# Patient Record
Sex: Female | Born: 1945 | Race: White | Hispanic: No | Marital: Married | Smoking: Never smoker
Health system: Southern US, Community
[De-identification: ages and names within clinical notes are randomized; demographics above are authoritative.]

## PROBLEM LIST (undated history)

## (undated) DIAGNOSIS — T753XXA Motion sickness, initial encounter: Secondary | ICD-10-CM

## (undated) DIAGNOSIS — M436 Torticollis: Secondary | ICD-10-CM

## (undated) DIAGNOSIS — K219 Gastro-esophageal reflux disease without esophagitis: Secondary | ICD-10-CM

## (undated) DIAGNOSIS — Z9221 Personal history of antineoplastic chemotherapy: Secondary | ICD-10-CM

## (undated) DIAGNOSIS — F32A Depression, unspecified: Secondary | ICD-10-CM

## (undated) DIAGNOSIS — Z923 Personal history of irradiation: Secondary | ICD-10-CM

## (undated) DIAGNOSIS — R011 Cardiac murmur, unspecified: Secondary | ICD-10-CM

## (undated) DIAGNOSIS — C50919 Malignant neoplasm of unspecified site of unspecified female breast: Secondary | ICD-10-CM

## (undated) DIAGNOSIS — F419 Anxiety disorder, unspecified: Secondary | ICD-10-CM

## (undated) DIAGNOSIS — C801 Malignant (primary) neoplasm, unspecified: Secondary | ICD-10-CM

## (undated) DIAGNOSIS — M1990 Unspecified osteoarthritis, unspecified site: Secondary | ICD-10-CM

## (undated) DIAGNOSIS — Z8719 Personal history of other diseases of the digestive system: Secondary | ICD-10-CM

## (undated) DIAGNOSIS — Z973 Presence of spectacles and contact lenses: Secondary | ICD-10-CM

## (undated) DIAGNOSIS — F329 Major depressive disorder, single episode, unspecified: Secondary | ICD-10-CM

## (undated) HISTORY — PX: BUNIONECTOMY: SHX129

## (undated) HISTORY — PX: JOINT REPLACEMENT: SHX530

## (undated) HISTORY — PX: COLONOSCOPY: SHX174

---
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
## 1978-07-02 HISTORY — PX: TUBAL LIGATION: SHX77

## 1980-08-05 HISTORY — PX: HEMORROIDECTOMY: SUR656

## 1989-08-05 HISTORY — PX: ABDOMINAL HYSTERECTOMY: SHX81

## 1998-07-31 ENCOUNTER — Encounter: Payer: Self-pay | Admitting: Orthopedic Surgery

## 1998-07-31 ENCOUNTER — Ambulatory Visit (HOSPITAL_COMMUNITY): Admission: RE | Admit: 1998-07-31 | Discharge: 1998-07-31 | Payer: Self-pay | Admitting: Orthopedic Surgery

## 1998-08-03 ENCOUNTER — Encounter: Admission: RE | Admit: 1998-08-03 | Discharge: 1998-11-01 | Payer: Self-pay | Admitting: Anesthesiology

## 1999-08-01 HISTORY — PX: KNEE ARTHROSCOPY: SHX127

## 2000-02-11 ENCOUNTER — Ambulatory Visit (HOSPITAL_COMMUNITY): Admission: RE | Admit: 2000-02-11 | Discharge: 2000-02-11 | Payer: Self-pay | Admitting: Orthopedic Surgery

## 2000-06-13 ENCOUNTER — Encounter: Payer: Self-pay | Admitting: Family Medicine

## 2000-06-13 ENCOUNTER — Encounter: Admission: RE | Admit: 2000-06-13 | Discharge: 2000-06-13 | Payer: Self-pay | Admitting: Family Medicine

## 2002-05-13 ENCOUNTER — Ambulatory Visit (HOSPITAL_COMMUNITY): Admission: RE | Admit: 2002-05-13 | Discharge: 2002-05-13 | Payer: Self-pay | Admitting: Urology

## 2002-05-13 ENCOUNTER — Encounter: Payer: Self-pay | Admitting: Urology

## 2003-08-06 DIAGNOSIS — C801 Malignant (primary) neoplasm, unspecified: Secondary | ICD-10-CM

## 2003-08-06 DIAGNOSIS — C50919 Malignant neoplasm of unspecified site of unspecified female breast: Secondary | ICD-10-CM

## 2003-08-06 HISTORY — DX: Malignant neoplasm of unspecified site of unspecified female breast: C50.919

## 2003-08-06 HISTORY — PX: BREAST EXCISIONAL BIOPSY: SUR124

## 2003-08-06 HISTORY — PX: BREAST BIOPSY: SHX20

## 2003-08-06 HISTORY — DX: Malignant (primary) neoplasm, unspecified: C80.1

## 2004-01-24 ENCOUNTER — Other Ambulatory Visit: Payer: Self-pay

## 2004-05-05 ENCOUNTER — Ambulatory Visit: Payer: Self-pay | Admitting: Oncology

## 2004-06-05 ENCOUNTER — Ambulatory Visit: Payer: Self-pay | Admitting: Oncology

## 2004-07-05 ENCOUNTER — Ambulatory Visit: Payer: Self-pay | Admitting: Oncology

## 2004-08-05 ENCOUNTER — Ambulatory Visit: Payer: Self-pay | Admitting: Oncology

## 2004-08-17 ENCOUNTER — Ambulatory Visit: Payer: Self-pay | Admitting: General Surgery

## 2004-09-05 ENCOUNTER — Ambulatory Visit: Payer: Self-pay | Admitting: Oncology

## 2005-01-29 ENCOUNTER — Ambulatory Visit: Payer: Self-pay | Admitting: Oncology

## 2005-02-13 ENCOUNTER — Ambulatory Visit: Payer: Self-pay | Admitting: General Surgery

## 2005-02-20 ENCOUNTER — Ambulatory Visit: Payer: Self-pay | Admitting: Oncology

## 2005-03-05 ENCOUNTER — Ambulatory Visit: Payer: Self-pay | Admitting: Oncology

## 2005-05-23 ENCOUNTER — Ambulatory Visit: Payer: Self-pay | Admitting: Oncology

## 2005-06-05 ENCOUNTER — Ambulatory Visit: Payer: Self-pay | Admitting: Oncology

## 2005-08-22 ENCOUNTER — Ambulatory Visit: Payer: Self-pay | Admitting: Oncology

## 2005-09-05 ENCOUNTER — Ambulatory Visit: Payer: Self-pay | Admitting: Oncology

## 2005-09-27 ENCOUNTER — Ambulatory Visit: Payer: Self-pay | Admitting: General Surgery

## 2005-12-19 ENCOUNTER — Ambulatory Visit: Payer: Self-pay | Admitting: Oncology

## 2006-01-03 ENCOUNTER — Ambulatory Visit: Payer: Self-pay | Admitting: Oncology

## 2006-03-27 ENCOUNTER — Ambulatory Visit: Payer: Self-pay | Admitting: General Surgery

## 2006-04-04 ENCOUNTER — Ambulatory Visit: Payer: Self-pay | Admitting: Gastroenterology

## 2006-04-18 ENCOUNTER — Ambulatory Visit: Payer: Self-pay | Admitting: Oncology

## 2006-05-05 ENCOUNTER — Ambulatory Visit: Payer: Self-pay | Admitting: Oncology

## 2006-05-08 ENCOUNTER — Ambulatory Visit: Payer: Self-pay | Admitting: Gastroenterology

## 2006-05-12 ENCOUNTER — Other Ambulatory Visit: Payer: Self-pay

## 2006-05-12 ENCOUNTER — Emergency Department: Payer: Self-pay | Admitting: Emergency Medicine

## 2006-08-27 ENCOUNTER — Ambulatory Visit: Payer: Self-pay | Admitting: Oncology

## 2006-09-05 ENCOUNTER — Ambulatory Visit: Payer: Self-pay | Admitting: Oncology

## 2006-09-25 ENCOUNTER — Ambulatory Visit: Payer: Self-pay | Admitting: General Surgery

## 2006-10-04 ENCOUNTER — Ambulatory Visit: Payer: Self-pay | Admitting: Oncology

## 2007-01-04 ENCOUNTER — Ambulatory Visit: Payer: Self-pay | Admitting: Internal Medicine

## 2007-01-22 ENCOUNTER — Ambulatory Visit: Payer: Self-pay | Admitting: Internal Medicine

## 2007-02-03 ENCOUNTER — Ambulatory Visit: Payer: Self-pay | Admitting: Internal Medicine

## 2007-08-06 ENCOUNTER — Ambulatory Visit: Payer: Self-pay | Admitting: Internal Medicine

## 2007-08-13 ENCOUNTER — Ambulatory Visit: Payer: Self-pay | Admitting: Internal Medicine

## 2007-09-06 ENCOUNTER — Ambulatory Visit: Payer: Self-pay | Admitting: Internal Medicine

## 2007-11-04 ENCOUNTER — Ambulatory Visit: Payer: Self-pay | Admitting: Oncology

## 2007-11-10 ENCOUNTER — Ambulatory Visit: Payer: Self-pay | Admitting: General Surgery

## 2008-02-03 ENCOUNTER — Ambulatory Visit: Payer: Self-pay | Admitting: Internal Medicine

## 2008-02-11 ENCOUNTER — Ambulatory Visit: Payer: Self-pay | Admitting: Internal Medicine

## 2008-03-05 ENCOUNTER — Ambulatory Visit: Payer: Self-pay | Admitting: Internal Medicine

## 2008-08-05 ENCOUNTER — Ambulatory Visit: Payer: Self-pay | Admitting: Internal Medicine

## 2008-08-25 ENCOUNTER — Ambulatory Visit: Payer: Self-pay | Admitting: Internal Medicine

## 2008-09-05 ENCOUNTER — Ambulatory Visit: Payer: Self-pay | Admitting: Internal Medicine

## 2008-09-06 HISTORY — PX: CARPAL TUNNEL RELEASE: SHX101

## 2008-10-07 HISTORY — PX: FOOT SURGERY: SHX648

## 2008-11-11 ENCOUNTER — Ambulatory Visit: Payer: Self-pay | Admitting: Internal Medicine

## 2009-01-03 ENCOUNTER — Ambulatory Visit: Payer: Self-pay | Admitting: Internal Medicine

## 2009-01-13 ENCOUNTER — Ambulatory Visit: Payer: Self-pay | Admitting: Internal Medicine

## 2009-02-02 ENCOUNTER — Ambulatory Visit: Payer: Self-pay | Admitting: Internal Medicine

## 2009-04-11 ENCOUNTER — Ambulatory Visit: Payer: Self-pay | Admitting: Family Medicine

## 2009-04-13 HISTORY — PX: THUMB ARTHROSCOPY: SHX2509

## 2009-05-15 ENCOUNTER — Ambulatory Visit: Payer: Self-pay | Admitting: Gastroenterology

## 2009-11-29 ENCOUNTER — Ambulatory Visit: Payer: Self-pay | Admitting: Internal Medicine

## 2010-01-03 ENCOUNTER — Ambulatory Visit: Payer: Self-pay | Admitting: Internal Medicine

## 2010-01-11 ENCOUNTER — Ambulatory Visit: Payer: Self-pay | Admitting: Internal Medicine

## 2010-02-02 ENCOUNTER — Ambulatory Visit: Payer: Self-pay | Admitting: Internal Medicine

## 2010-12-05 ENCOUNTER — Ambulatory Visit: Payer: Self-pay | Admitting: Internal Medicine

## 2011-01-04 HISTORY — PX: HAMMER TOE SURGERY: SHX385

## 2011-02-01 ENCOUNTER — Ambulatory Visit: Payer: Self-pay | Admitting: Internal Medicine

## 2011-05-16 ENCOUNTER — Ambulatory Visit: Payer: Self-pay | Admitting: Internal Medicine

## 2011-05-17 LAB — CANCER ANTIGEN 27.29: CA 27.29: 29.4 U/mL (ref 0.0–38.6)

## 2011-06-06 ENCOUNTER — Ambulatory Visit: Payer: Self-pay | Admitting: Internal Medicine

## 2011-08-08 ENCOUNTER — Ambulatory Visit: Payer: Self-pay | Admitting: Family Medicine

## 2011-12-10 ENCOUNTER — Ambulatory Visit: Payer: Self-pay | Admitting: Internal Medicine

## 2011-12-12 ENCOUNTER — Ambulatory Visit: Payer: Self-pay | Admitting: Internal Medicine

## 2011-12-13 ENCOUNTER — Ambulatory Visit: Payer: Self-pay | Admitting: Internal Medicine

## 2012-04-28 ENCOUNTER — Ambulatory Visit: Payer: Self-pay | Admitting: Oncology

## 2012-05-08 ENCOUNTER — Ambulatory Visit: Payer: Self-pay | Admitting: Oncology

## 2012-05-08 LAB — CBC CANCER CENTER
Basophil #: 0.1 x10 3/mm (ref 0.0–0.1)
Basophil %: 1 %
Eosinophil #: 0.3 x10 3/mm (ref 0.0–0.7)
Eosinophil %: 4.8 %
HCT: 42.5 % (ref 35.0–47.0)
HGB: 13.8 g/dL (ref 12.0–16.0)
Lymphocyte #: 1.7 x10 3/mm (ref 1.0–3.6)
Lymphocyte %: 29.2 %
MCH: 31.3 pg (ref 26.0–34.0)
MCHC: 32.5 g/dL (ref 32.0–36.0)
MCV: 96 fL (ref 80–100)
Monocyte #: 0.4 x10 3/mm (ref 0.2–0.9)
Monocyte %: 6.4 %
Neutrophil #: 3.4 x10 3/mm (ref 1.4–6.5)
Neutrophil %: 58.6 %
Platelet: 201 x10 3/mm (ref 150–440)
RBC: 4.42 10*6/uL (ref 3.80–5.20)
RDW: 12.3 % (ref 11.5–14.5)
WBC: 5.8 x10 3/mm (ref 3.6–11.0)

## 2012-05-08 LAB — COMPREHENSIVE METABOLIC PANEL
Albumin: 3.5 g/dL (ref 3.4–5.0)
Alkaline Phosphatase: 73 U/L (ref 50–136)
Anion Gap: 11 (ref 7–16)
BUN: 19 mg/dL — ABNORMAL HIGH (ref 7–18)
Bilirubin,Total: 0.3 mg/dL (ref 0.2–1.0)
Calcium, Total: 8.9 mg/dL (ref 8.5–10.1)
Chloride: 104 mmol/L (ref 98–107)
Co2: 26 mmol/L (ref 21–32)
Creatinine: 0.92 mg/dL (ref 0.60–1.30)
EGFR (African American): >60
EGFR (Non-African Amer.): >60
Glucose: 95 mg/dL (ref 65–99)
Osmolality: 283 (ref 275–301)
Potassium: 4 mmol/L (ref 3.5–5.1)
SGOT(AST): 22 U/L (ref 15–37)
SGPT (ALT): 30 U/L (ref 12–78)
Sodium: 141 mmol/L (ref 136–145)
Total Protein: 7 g/dL (ref 6.4–8.2)

## 2012-05-12 LAB — CANCER ANTIGEN 27.29: CA 27.29: 25.9 U/mL (ref 0.0–38.6)

## 2012-06-05 ENCOUNTER — Ambulatory Visit: Payer: Self-pay | Admitting: Oncology

## 2012-09-25 ENCOUNTER — Ambulatory Visit: Payer: Self-pay | Admitting: Unknown Physician Specialty

## 2012-09-28 LAB — PATHOLOGY REPORT

## 2012-12-14 ENCOUNTER — Ambulatory Visit: Payer: Self-pay | Admitting: Oncology

## 2013-05-17 ENCOUNTER — Ambulatory Visit: Payer: Self-pay | Admitting: Oncology

## 2013-05-18 LAB — CANCER ANTIGEN 27.29: CA 27.29: 29.3 U/mL (ref 0.0–38.6)

## 2013-06-05 ENCOUNTER — Ambulatory Visit: Payer: Self-pay | Admitting: Oncology

## 2013-08-05 HISTORY — PX: BREAST BIOPSY: SHX20

## 2013-12-16 ENCOUNTER — Ambulatory Visit: Payer: Self-pay | Admitting: Oncology

## 2013-12-23 ENCOUNTER — Ambulatory Visit: Payer: Self-pay | Admitting: Oncology

## 2013-12-28 ENCOUNTER — Ambulatory Visit: Payer: Self-pay | Admitting: Oncology

## 2013-12-29 LAB — PATHOLOGY REPORT

## 2014-02-28 ENCOUNTER — Other Ambulatory Visit: Payer: Self-pay | Admitting: Podiatry

## 2014-05-23 ENCOUNTER — Ambulatory Visit: Payer: Self-pay | Admitting: Oncology

## 2014-05-24 LAB — CANCER ANTIGEN 27.29: CA 27.29: 20.9 U/mL (ref 0.0–38.6)

## 2014-06-05 ENCOUNTER — Ambulatory Visit: Payer: Self-pay | Admitting: Oncology

## 2015-02-06 ENCOUNTER — Other Ambulatory Visit: Payer: Self-pay | Admitting: Podiatry

## 2015-04-19 ENCOUNTER — Other Ambulatory Visit: Payer: Self-pay | Admitting: Urology

## 2015-04-19 DIAGNOSIS — Q6211 Congenital occlusion of ureteropelvic junction: Principal | ICD-10-CM

## 2015-04-19 DIAGNOSIS — Q6239 Other obstructive defects of renal pelvis and ureter: Secondary | ICD-10-CM

## 2015-04-24 ENCOUNTER — Ambulatory Visit (HOSPITAL_COMMUNITY): Payer: Medicare HMO

## 2015-05-02 ENCOUNTER — Ambulatory Visit (HOSPITAL_COMMUNITY)
Admission: RE | Admit: 2015-05-02 | Discharge: 2015-05-02 | Disposition: A | Discharge status: Home / Self Care | Payer: Medicare HMO | Source: Ambulatory Visit | Attending: Urology | Admitting: Urology

## 2015-05-02 DIAGNOSIS — Q6239 Other obstructive defects of renal pelvis and ureter: Secondary | ICD-10-CM | POA: Insufficient documentation

## 2015-05-02 DIAGNOSIS — Q6211 Congenital occlusion of ureteropelvic junction: Secondary | ICD-10-CM

## 2015-05-02 MED ORDER — FUROSEMIDE 10 MG/ML IJ SOLN: 37.0000 mg | Freq: Once | INTRAMUSCULAR | Status: DC

## 2015-05-02 MED ORDER — TECHNETIUM TC 99M MERTIATIDE
15.0000 | Freq: Once | INTRAVENOUS | Status: AC | PRN
  Administered 2015-05-02: 15.7 via INTRAVENOUS

## 2015-05-02 MED ORDER — FUROSEMIDE 10 MG/ML IJ SOLN
INTRAMUSCULAR | Status: AC
  Filled 2015-05-02: qty 4

## 2015-08-09 ENCOUNTER — Other Ambulatory Visit: Payer: Self-pay | Admitting: Family Medicine

## 2015-08-09 DIAGNOSIS — Z1231 Encounter for screening mammogram for malignant neoplasm of breast: Secondary | ICD-10-CM

## 2015-08-30 ENCOUNTER — Ambulatory Visit
Admission: RE | Admit: 2015-08-30 | Discharge: 2015-08-30 | Disposition: A | Discharge status: Home / Self Care | Payer: Medicare HMO | Source: Ambulatory Visit | Attending: Family Medicine | Admitting: Family Medicine

## 2015-08-30 ENCOUNTER — Other Ambulatory Visit: Payer: Self-pay | Admitting: Family Medicine

## 2015-08-30 DIAGNOSIS — Z1231 Encounter for screening mammogram for malignant neoplasm of breast: Secondary | ICD-10-CM | POA: Insufficient documentation

## 2015-08-30 HISTORY — DX: Malignant neoplasm of unspecified site of unspecified female breast: C50.919

## 2015-08-30 HISTORY — DX: Malignant (primary) neoplasm, unspecified: C80.1

## 2016-08-28 ENCOUNTER — Other Ambulatory Visit: Payer: Self-pay | Admitting: Student

## 2016-08-28 DIAGNOSIS — R599 Enlarged lymph nodes, unspecified: Secondary | ICD-10-CM

## 2016-08-28 DIAGNOSIS — N888 Other specified noninflammatory disorders of cervix uteri: Secondary | ICD-10-CM

## 2016-09-05 ENCOUNTER — Telehealth: Payer: Self-pay | Admitting: Student

## 2016-09-06 ENCOUNTER — Ambulatory Visit
Admission: RE | Admit: 2016-09-06 | Discharge: 2016-09-06 | Disposition: A | Discharge status: Home / Self Care | Payer: Medicare HMO | Source: Ambulatory Visit | Attending: Student | Admitting: Student

## 2016-09-06 ENCOUNTER — Ambulatory Visit: Payer: Medicare HMO

## 2016-09-06 DIAGNOSIS — N888 Other specified noninflammatory disorders of cervix uteri: Secondary | ICD-10-CM

## 2016-09-06 DIAGNOSIS — R599 Enlarged lymph nodes, unspecified: Secondary | ICD-10-CM

## 2016-09-12 ENCOUNTER — Other Ambulatory Visit: Payer: Self-pay | Admitting: Family Medicine

## 2016-09-12 DIAGNOSIS — Z1231 Encounter for screening mammogram for malignant neoplasm of breast: Secondary | ICD-10-CM

## 2016-09-13 ENCOUNTER — Ambulatory Visit
Admission: RE | Admit: 2016-09-13 | Discharge: 2016-09-13 | Disposition: A | Discharge status: Home / Self Care | Payer: Medicare HMO | Source: Ambulatory Visit | Attending: Student | Admitting: Student

## 2016-09-13 DIAGNOSIS — R599 Enlarged lymph nodes, unspecified: Secondary | ICD-10-CM | POA: Diagnosis present

## 2016-09-13 DIAGNOSIS — M4692 Unspecified inflammatory spondylopathy, cervical region: Secondary | ICD-10-CM | POA: Insufficient documentation

## 2016-09-13 MED ORDER — GADOBENATE DIMEGLUMINE 529 MG/ML IV SOLN
14.0000 mL | Freq: Once | INTRAVENOUS | Status: AC | PRN
  Administered 2016-09-13: 14 mL via INTRAVENOUS

## 2016-09-19 ENCOUNTER — Ambulatory Visit: Payer: Medicare HMO

## 2016-10-11 ENCOUNTER — Ambulatory Visit
Admission: RE | Admit: 2016-10-11 | Discharge: 2016-10-11 | Disposition: A | Discharge status: Home / Self Care | Payer: Medicare HMO | Source: Ambulatory Visit | Attending: Family Medicine | Admitting: Family Medicine

## 2016-10-11 DIAGNOSIS — Z1231 Encounter for screening mammogram for malignant neoplasm of breast: Secondary | ICD-10-CM | POA: Diagnosis present

## 2017-09-09 ENCOUNTER — Other Ambulatory Visit: Payer: Self-pay | Admitting: Family Medicine

## 2017-09-09 DIAGNOSIS — Z1231 Encounter for screening mammogram for malignant neoplasm of breast: Secondary | ICD-10-CM

## 2017-10-14 ENCOUNTER — Ambulatory Visit
Admission: RE | Admit: 2017-10-14 | Discharge: 2017-10-14 | Disposition: A | Discharge status: Home / Self Care | Payer: Medicare HMO | Source: Ambulatory Visit | Attending: Family Medicine | Admitting: Family Medicine

## 2017-10-14 DIAGNOSIS — Z1231 Encounter for screening mammogram for malignant neoplasm of breast: Secondary | ICD-10-CM | POA: Diagnosis not present

## 2017-10-14 HISTORY — DX: Personal history of antineoplastic chemotherapy: Z92.21

## 2017-10-14 HISTORY — DX: Personal history of irradiation: Z92.3

## 2018-01-27 ENCOUNTER — Encounter: Payer: Self-pay | Admitting: *Deleted

## 2018-01-27 ENCOUNTER — Other Ambulatory Visit: Payer: Self-pay

## 2018-01-30 NOTE — Discharge Instructions (Signed)

## 2018-02-03 ENCOUNTER — Encounter
Admission: RE | Disposition: A | Discharge status: Home / Self Care | Payer: Self-pay | Source: Ambulatory Visit | Attending: Ophthalmology

## 2018-02-03 ENCOUNTER — Ambulatory Visit: Payer: Medicare HMO | Admitting: Anesthesiology

## 2018-02-03 ENCOUNTER — Ambulatory Visit
Admission: RE | Admit: 2018-02-03 | Discharge: 2018-02-03 | Disposition: A | Discharge status: Home / Self Care | Payer: Medicare HMO | Source: Ambulatory Visit | Attending: Ophthalmology | Admitting: Ophthalmology

## 2018-02-03 DIAGNOSIS — M810 Age-related osteoporosis without current pathological fracture: Secondary | ICD-10-CM | POA: Diagnosis not present

## 2018-02-03 DIAGNOSIS — Z885 Allergy status to narcotic agent status: Secondary | ICD-10-CM | POA: Diagnosis not present

## 2018-02-03 DIAGNOSIS — K219 Gastro-esophageal reflux disease without esophagitis: Secondary | ICD-10-CM | POA: Insufficient documentation

## 2018-02-03 DIAGNOSIS — Z8619 Personal history of other infectious and parasitic diseases: Secondary | ICD-10-CM | POA: Insufficient documentation

## 2018-02-03 DIAGNOSIS — Z79899 Other long term (current) drug therapy: Secondary | ICD-10-CM | POA: Insufficient documentation

## 2018-02-03 DIAGNOSIS — Z853 Personal history of malignant neoplasm of breast: Secondary | ICD-10-CM | POA: Diagnosis not present

## 2018-02-03 DIAGNOSIS — H2511 Age-related nuclear cataract, right eye: Secondary | ICD-10-CM | POA: Insufficient documentation

## 2018-02-03 DIAGNOSIS — Z8719 Personal history of other diseases of the digestive system: Secondary | ICD-10-CM | POA: Insufficient documentation

## 2018-02-03 DIAGNOSIS — F329 Major depressive disorder, single episode, unspecified: Secondary | ICD-10-CM | POA: Diagnosis not present

## 2018-02-03 HISTORY — PX: CATARACT EXTRACTION W/PHACO: SHX586

## 2018-02-03 HISTORY — DX: Motion sickness, initial encounter: T75.3XXA

## 2018-02-03 HISTORY — DX: Gastro-esophageal reflux disease without esophagitis: K21.9

## 2018-02-03 HISTORY — DX: Unspecified osteoarthritis, unspecified site: M19.90

## 2018-02-03 HISTORY — DX: Torticollis: M43.6

## 2018-02-03 HISTORY — DX: Presence of spectacles and contact lenses: Z97.3

## 2018-02-03 SURGERY — PHACOEMULSIFICATION, CATARACT, WITH IOL INSERTION
Anesthesia: Monitor Anesthesia Care | Site: Eye | Laterality: Right | Wound class: Clean

## 2018-02-03 MED ORDER — CEFUROXIME OPHTHALMIC INJECTION 1 MG/0.1 ML
INJECTION | OPHTHALMIC | Status: DC | PRN
  Administered 2018-02-03: 0.1 mL via INTRACAMERAL

## 2018-02-03 MED ORDER — LACTATED RINGERS IV SOLN: INTRAVENOUS | Status: DC

## 2018-02-03 MED ORDER — LIDOCAINE HCL (PF) 2 % IJ SOLN
INTRAOCULAR | Status: DC | PRN
  Administered 2018-02-03: 2 mL

## 2018-02-03 MED ORDER — MIDAZOLAM HCL 2 MG/2ML IJ SOLN
INTRAMUSCULAR | Status: DC | PRN
  Administered 2018-02-03 (×2): 1 mg via INTRAVENOUS

## 2018-02-03 MED ORDER — ARMC OPHTHALMIC DILATING DROPS
1.0000 "application " | OPHTHALMIC | Status: DC | PRN
  Administered 2018-02-03 (×3): 1 via OPHTHALMIC

## 2018-02-03 MED ORDER — EPINEPHRINE PF 1 MG/ML IJ SOLN
INTRAOCULAR | Status: DC | PRN
  Administered 2018-02-03: 46 mL via OPHTHALMIC

## 2018-02-03 MED ORDER — BRIMONIDINE TARTRATE-TIMOLOL 0.2-0.5 % OP SOLN
OPHTHALMIC | Status: DC | PRN
  Administered 2018-02-03: 1 [drp] via OPHTHALMIC

## 2018-02-03 MED ORDER — NA HYALUR & NA CHOND-NA HYALUR 0.4-0.35 ML IO KIT
PACK | INTRAOCULAR | Status: DC | PRN
  Administered 2018-02-03: 1 mL via INTRAOCULAR

## 2018-02-03 MED ORDER — FENTANYL CITRATE (PF) 100 MCG/2ML IJ SOLN
INTRAMUSCULAR | Status: DC | PRN
  Administered 2018-02-03 (×2): 50 ug via INTRAVENOUS

## 2018-02-03 MED ORDER — MOXIFLOXACIN HCL 0.5 % OP SOLN
1.0000 [drp] | OPHTHALMIC | Status: DC | PRN
  Administered 2018-02-03 (×3): 1 [drp] via OPHTHALMIC

## 2018-02-03 SURGICAL SUPPLY — 20 items
CANNULA ANT/CHMB 27G (MISCELLANEOUS) ×1 IMPLANT
CANNULA ANT/CHMB 27GA (MISCELLANEOUS) ×2 IMPLANT
GLOVE SURG LX 7.5 STRW (GLOVE) ×1
GLOVE SURG LX STRL 7.5 STRW (GLOVE) ×1 IMPLANT
GLOVE SURG TRIUMPH 8.0 PF LTX (GLOVE) ×2 IMPLANT
GOWN STRL REUS W/ TWL LRG LVL3 (GOWN DISPOSABLE) ×2 IMPLANT
GOWN STRL REUS W/TWL LRG LVL3 (GOWN DISPOSABLE) ×4
LENS IOL TECNIS ITEC 23.5 (Intraocular Lens) ×1 IMPLANT
MARKER SKIN DUAL TIP RULER LAB (MISCELLANEOUS) ×2 IMPLANT
NDL FILTER BLUNT 18X1 1/2 (NEEDLE) ×1 IMPLANT
NEEDLE FILTER BLUNT 18X 1/2SAF (NEEDLE) ×1
NEEDLE FILTER BLUNT 18X1 1/2 (NEEDLE) ×1 IMPLANT
PACK CATARACT BRASINGTON (MISCELLANEOUS) ×2 IMPLANT
PACK EYE AFTER SURG (MISCELLANEOUS) ×2 IMPLANT
PACK OPTHALMIC (MISCELLANEOUS) ×2 IMPLANT
SYR 3ML LL SCALE MARK (SYRINGE) ×2 IMPLANT
SYR 5ML LL (SYRINGE) ×2 IMPLANT
SYR TB 1ML LUER SLIP (SYRINGE) ×2 IMPLANT
WATER STERILE IRR 500ML POUR (IV SOLUTION) ×2 IMPLANT
WIPE NON LINTING 3.25X3.25 (MISCELLANEOUS) ×2 IMPLANT

## 2018-02-03 NOTE — Anesthesia Postprocedure Evaluation (Signed)
Anesthesia Post Note  Patient: Miranda Hampton  Procedure(s) Performed: CATARACT EXTRACTION PHACO AND INTRAOCULAR LENS PLACEMENT (IOC) RIGHT (Right Eye)  Patient location during evaluation: PACU Anesthesia Type: MAC Level of consciousness: awake and alert Pain management: pain level controlled Vital Signs Assessment: post-procedure vital signs reviewed and stable Respiratory status: spontaneous breathing Cardiovascular status: blood pressure returned to baseline Postop Assessment: no headache Anesthetic complications: no    Jaci Standard, III,  Genaro Bekker D

## 2018-02-03 NOTE — Anesthesia Preprocedure Evaluation (Signed)
Anesthesia Evaluation  Patient identified by MRN, date of birth, ID band Patient awake    Reviewed: Allergy & Precautions, H&P , NPO status , Patient's Chart, lab work & pertinent test results  Airway Mallampati: I  TM Distance: >3 FB Neck ROM: full    Dental no notable dental hx.    Pulmonary neg pulmonary ROS,    Pulmonary exam normal        Cardiovascular negative cardio ROS Normal cardiovascular exam     Neuro/Psych    GI/Hepatic Neg liver ROS, Medicated,  Endo/Other  negative endocrine ROS  Renal/GU negative Renal ROS     Musculoskeletal   Abdominal   Peds  Hematology negative hematology ROS (+)   Anesthesia Other Findings   Reproductive/Obstetrics negative OB ROS                            Anesthesia Physical Anesthesia Plan  ASA: II  Anesthesia Plan: MAC   Post-op Pain Management:    Induction:   PONV Risk Score and Plan:   Airway Management Planned:   Additional Equipment:   Intra-op Plan:   Post-operative Plan:   Informed Consent: I have reviewed the patients History and Physical, chart, labs and discussed the procedure including the risks, benefits and alternatives for the proposed anesthesia with the patient or authorized representative who has indicated his/her understanding and acceptance.     Plan Discussed with:   Anesthesia Plan Comments:         Anesthesia Quick Evaluation

## 2018-02-03 NOTE — Transfer of Care (Signed)
Immediate Anesthesia Transfer of Care Note  Patient: Miranda Hampton  Procedure(s) Performed: CATARACT EXTRACTION PHACO AND INTRAOCULAR LENS PLACEMENT (IOC) RIGHT (Right Eye)  Patient Location: PACU  Anesthesia Type: MAC  Level of Consciousness: awake, alert  and patient cooperative  Airway and Oxygen Therapy: Patient Spontanous Breathing and Patient connected to supplemental oxygen  Post-op Assessment: Post-op Vital signs reviewed, Patient's Cardiovascular Status Stable, Respiratory Function Stable, Patent Airway and No signs of Nausea or vomiting  Post-op Vital Signs: Reviewed and stable  Complications: No apparent anesthesia complications

## 2018-02-03 NOTE — Op Note (Signed)
LOCATION:  [facility]   PREOPERATIVE DIAGNOSIS:    Nuclear sclerotic cataract right eye. H25.11   POSTOPERATIVE DIAGNOSIS:  Nuclear sclerotic cataract right eye.     PROCEDURE:  Phacoemusification with posterior chamber intraocular lens placement of the right eye   LENS:   Implant Name Type Inv. Item Serial No. Manufacturer Lot No. LRB No. Used  LENS IOL DIOP 23.5 - [id number] Intraocular Lens LENS IOL DIOP 23.5 [id number]  Right 1        ULTRASOUND TIME: 12 % of 0 minutes, 50 seconds.  CDE 6.5   SURGEON:  Wyonia Hough, MD   ANESTHESIA:  Topical with tetracaine drops and 2% Xylocaine jelly, augmented with 1% preservative-free intracameral lidocaine.    COMPLICATIONS:  None.   DESCRIPTION OF PROCEDURE:  The patient was identified in the holding room and transported to the operating room and placed in the supine position under the operating microscope.  The right eye was identified as the operative eye and it was prepped and draped in the usual sterile ophthalmic fashion.   A 1 millimeter clear-corneal paracentesis was made at the 12:00 position.  0.5 ml of preservative-free 1% lidocaine was injected into the anterior chamber. The anterior chamber was filled with Viscoat viscoelastic.  A 2.4 millimeter keratome was used to make a near-clear corneal incision at the 9:00 position.  A curvilinear capsulorrhexis was made with a cystotome and capsulorrhexis forceps.  Balanced salt solution was used to hydrodissect and hydrodelineate the nucleus.   Phacoemulsification was then used in stop and chop fashion to remove the lens nucleus and epinucleus.  The remaining cortex was then removed using the irrigation and aspiration handpiece. Provisc was then placed into the capsular bag to distend it for lens placement.  A lens was then injected into the capsular bag.  The remaining viscoelastic was aspirated.   Wounds were hydrated with balanced salt solution.  The anterior  chamber was inflated to a physiologic pressure with balanced salt solution.  No wound leaks were noted. Cefuroxime 0.1 ml of a 10mg /ml solution was injected into the anterior chamber for a dose of 1 mg of intracameral antibiotic at the completion of the case.   Timolol and Brimonidine drops were applied to the eye.  The patient was taken to the recovery room in stable condition without complications of anesthesia or surgery.   Larna Capelle 02/03/2018, 11:21 AM

## 2018-02-03 NOTE — Anesthesia Procedure Notes (Signed)
Procedure Name: MAC Date/Time: 02/03/2018 11:13 AM Performed by: Lind Guest, CRNA Pre-anesthesia Checklist: Patient identified, Emergency Drugs available, Suction available, Patient being monitored and Timeout performed Patient Re-evaluated:Patient Re-evaluated prior to induction Oxygen Delivery Method: Nasal cannula

## 2018-02-03 NOTE — H&P (Signed)
The History and Physical notes are on paper, have been signed, and are to be scanned. The patient remains stable and unchanged from the H&P.   Previous H&P reviewed, patient examined, and there are no changes.  Miranda Hampton 02/03/2018 10:48 AM

## 2018-02-04 ENCOUNTER — Encounter: Payer: Self-pay | Admitting: Ophthalmology

## 2018-02-04 NOTE — Progress Notes (Signed)
CLEARANCE DR. Jeneen Rinks HEDRICK ON CHART 01-26-18

## 2018-02-04 NOTE — Patient Instructions (Signed)
Miranda Hampton  02/04/2018   Your procedure is scheduled on: 02-18-18   Report to [facility] Main  Entrance            Report to admitting at      1130 AM    Call this number if you have problems the morning of surgery [phone]   Remember: Do not eat food  :After Midnight. YOU MAY HAVE CLEAR LIQUIDS UNTIL 0800 AM THEN NOTHING BY MOUTH     CLEAR LIQUID DIET   Foods Allowed                                                                     Foods Excluded  Coffee and tea, regular and decaf                             liquids that you cannot  Plain Jell-O in any flavor                                             see through such as: Fruit ices (not with fruit pulp)                                     milk, soups, orange juice  Iced Popsicles                                    All solid food Carbonated beverages, regular and diet                                    Cranberry, grape and apple juices Sports drinks like Gatorade Lightly seasoned clear broth or consume(fat free) Sugar, honey syrup  ____________________________________________________________________     Take these medicines the morning of surgery with A SIP OF WATER: ZOLOFT, OMEPRAZOLE, TYLENOL IF NEEDED                                You may not have any metal on your body including hair pins and              piercings  Do not wear jewelry, make-up, lotions, powders or perfumes, deodorant             Do not wear nail polish.  Do not shave  48 hours prior to surgery.  .   Do not bring valuables to the hospital. [place].  Contacts, dentures or bridgework may not be worn into surgery.  Leave suitcase in the car. After surgery it may be brought to your room.     Patients discharged the  day of surgery will not be allowed to drive home.  Name and phone number of your driver:  Special Instructions: N/A              Please read over  the following fact sheets you were given: _____________________________________________________________________             [facility] - Preparing for Surgery Before surgery, you can play an important role.  Because skin is not sterile, your skin needs to be as free of germs as possible.  You can reduce the number of germs on your skin by washing with CHG (chlorahexidine gluconate) soap before surgery.  CHG is an antiseptic cleaner which kills germs and bonds with the skin to continue killing germs even after washing. Please DO NOT use if you have an allergy to CHG or antibacterial soaps.  If your skin becomes reddened/irritated stop using the CHG and inform your nurse when you arrive at Short Stay. Do not shave (including legs and underarms) for at least 48 hours prior to the first CHG shower.  You may shave your face/neck. Please follow these instructions carefully:  1.  Shower with CHG Soap the night before surgery and the  morning of Surgery.  2.  If you choose to wash your hair, wash your hair first as usual with your  normal  shampoo.  3.  After you shampoo, rinse your hair and body thoroughly to remove the  shampoo.                           4.  Use CHG as you would any other liquid soap.  You can apply chg directly  to the skin and wash                       Gently with a scrungie or clean washcloth.  5.  Apply the CHG Soap to your body ONLY FROM THE NECK DOWN.   Do not use on face/ open                           Wound or open sores. Avoid contact with eyes, ears mouth and genitals (private parts).                       Wash face,  Genitals (private parts) with your normal soap.             6.  Wash thoroughly, paying special attention to the area where your surgery  will be performed.  7.  Thoroughly rinse your body with warm water from the neck down.  8.  DO NOT shower/wash with your normal soap after using and rinsing off  the CHG Soap.                9.  Pat yourself dry with a  clean towel.            10.  Wear clean pajamas.            11.  Place clean sheets on your bed the night of your first shower and do not  sleep with pets. Day of Surgery : Do not apply any lotions/deodorants the morning of surgery.  Please wear clean clothes to the hospital/surgery center.  FAILURE TO FOLLOW THESE INSTRUCTIONS MAY RESULT IN THE CANCELLATION OF YOUR SURGERY PATIENT SIGNATURE_________________________________  NURSE SIGNATURE__________________________________  ________________________________________________________________________  WHAT IS A BLOOD TRANSFUSION? Blood Transfusion Information  A transfusion is the replacement of blood or some of its parts. Blood is made up of multiple cells which provide different functions.  Red blood cells carry oxygen and are used for blood loss replacement.  White blood cells fight against infection.  Platelets control bleeding.  Plasma helps clot blood.  Other blood products are available for specialized needs, such as hemophilia or other clotting disorders. BEFORE THE TRANSFUSION  Who gives blood for transfusions?   Healthy volunteers who are fully evaluated to make sure their blood is safe. This is blood bank blood. Transfusion therapy is the safest it has ever been in the practice of medicine. Before blood is taken from a donor, a complete history is taken to make sure that person has no history of diseases nor engages in risky social behavior (examples are intravenous drug use or sexual activity with multiple partners). The donor's travel history is screened to minimize risk of transmitting infections, such as malaria. The donated blood is tested for signs of infectious diseases, such as HIV and hepatitis. The blood is then tested to be sure it is compatible with you in order to minimize the chance of a transfusion reaction. If you or a relative donates blood, this is often done in anticipation of surgery and is not appropriate for  emergency situations. It takes many days to process the donated blood. RISKS AND COMPLICATIONS Although transfusion therapy is very safe and saves many lives, the main dangers of transfusion include:   Getting an infectious disease.  Developing a transfusion reaction. This is an allergic reaction to something in the blood you were given. Every precaution is taken to prevent this. The decision to have a blood transfusion has been considered carefully by your caregiver before blood is given. Blood is not given unless the benefits outweigh the risks. AFTER THE TRANSFUSION  Right after receiving a blood transfusion, you will usually feel much better and more energetic. This is especially true if your red blood cells have gotten low (anemic). The transfusion raises the level of the red blood cells which carry oxygen, and this usually causes an energy increase.  The nurse administering the transfusion will monitor you carefully for complications. HOME CARE INSTRUCTIONS  No special instructions are needed after a transfusion. You may find your energy is better. Speak with your caregiver about any limitations on activity for underlying diseases you may have. SEEK MEDICAL CARE IF:   Your condition is not improving after your transfusion.  You develop redness or irritation at the intravenous (IV) site. SEEK IMMEDIATE MEDICAL CARE IF:  Any of the following symptoms occur over the next 12 hours:  Shaking chills.  You have a temperature by mouth above 102 F (38.9 C), not controlled by medicine.  Chest, back, or muscle pain.  People around you feel you are not acting correctly or are confused.  Shortness of breath or difficulty breathing.  Dizziness and fainting.  You get a rash or develop hives.  You have a decrease in urine output.  Your urine turns a dark color or changes to pink, red, or brown. Any of the following symptoms occur over the next 10 days:  You have a temperature by  mouth above 102 F (38.9 C), not controlled by medicine.  Shortness of breath.  Weakness after normal activity.  The white part of the eye turns yellow (jaundice).  You have a decrease in the amount of urine  or are urinating less often.  Your urine turns a dark color or changes to pink, red, or brown. Document Released: 07/19/2000 Document Revised: 10/14/2011 Document Reviewed: 03/07/2008 ExitCare Patient Information 2014 [place].  _______________________________________________________________________  Incentive Spirometer  An incentive spirometer is a tool that can help keep your lungs clear and active. This tool measures how well you are filling your lungs with each breath. Taking long deep breaths may help reverse or decrease the chance of developing breathing (pulmonary) problems (especially infection) following:  A long period of time when you are unable to move or be active. BEFORE THE PROCEDURE   If the spirometer includes an indicator to show your best effort, your nurse or respiratory therapist will set it to a desired goal.  If possible, sit up straight or lean slightly forward. Try not to slouch.  Hold the incentive spirometer in an upright position. INSTRUCTIONS FOR USE  1. Sit on the edge of your bed if possible, or sit up as far as you can in bed or on a chair. 2. Hold the incentive spirometer in an upright position. 3. Breathe out normally. 4. Place the mouthpiece in your mouth and seal your lips tightly around it. 5. Breathe in slowly and as deeply as possible, raising the piston or the ball toward the top of the column. 6. Hold your breath for 3-5 seconds or for as long as possible. Allow the piston or ball to fall to the bottom of the column. 7. Remove the mouthpiece from your mouth and breathe out normally. 8. Rest for a few seconds and repeat Steps 1 through 7 at least 10 times every 1-2 hours when you are awake. Take your time and take a few normal  breaths between deep breaths. 9. The spirometer may include an indicator to show your best effort. Use the indicator as a goal to work toward during each repetition. 10. After each set of 10 deep breaths, practice coughing to be sure your lungs are clear. If you have an incision (the cut made at the time of surgery), support your incision when coughing by placing a pillow or rolled up towels firmly against it. Once you are able to get out of bed, walk around indoors and cough well. You may stop using the incentive spirometer when instructed by your caregiver.  RISKS AND COMPLICATIONS  Take your time so you do not get dizzy or light-headed.  If you are in pain, you may need to take or ask for pain medication before doing incentive spirometry. It is harder to take a deep breath if you are having pain. AFTER USE  Rest and breathe slowly and easily.  It can be helpful to keep track of a log of your progress. Your caregiver can provide you with a simple table to help with this. If you are using the spirometer at home, follow these instructions: [place] IF:   You are having difficultly using the spirometer.  You have trouble using the spirometer as often as instructed.  Your pain medication is not giving enough relief while using the spirometer.  You develop fever of 100.5 F (38.1 C) or higher. SEEK IMMEDIATE MEDICAL CARE IF:   You cough up bloody sputum that had not been present before.  You develop fever of 102 F (38.9 C) or greater.  You develop worsening pain at or near the incision site. MAKE SURE YOU:   Understand these instructions.  Will watch your condition.  Will get help  right away if you are not doing well or get worse. Document Released: 12/02/2006 Document Revised: 10/14/2011 Document Reviewed: 02/02/2007 [facility] Patient Information 2014 [place], [place].   ________________________________________________________________________

## 2018-02-09 ENCOUNTER — Encounter (HOSPITAL_COMMUNITY)
Admission: RE | Admit: 2018-02-09 | Discharge: 2018-02-09 | Disposition: A | Discharge status: Home / Self Care | Payer: Medicare HMO | Source: Ambulatory Visit | Attending: Orthopedic Surgery | Admitting: Orthopedic Surgery

## 2018-02-09 ENCOUNTER — Encounter (HOSPITAL_COMMUNITY): Payer: Self-pay

## 2018-02-09 ENCOUNTER — Other Ambulatory Visit: Payer: Self-pay

## 2018-02-09 DIAGNOSIS — Z01818 Encounter for other preprocedural examination: Secondary | ICD-10-CM | POA: Insufficient documentation

## 2018-02-09 DIAGNOSIS — Z7982 Long term (current) use of aspirin: Secondary | ICD-10-CM | POA: Diagnosis not present

## 2018-02-09 DIAGNOSIS — F329 Major depressive disorder, single episode, unspecified: Secondary | ICD-10-CM | POA: Insufficient documentation

## 2018-02-09 DIAGNOSIS — M1612 Unilateral primary osteoarthritis, left hip: Secondary | ICD-10-CM | POA: Insufficient documentation

## 2018-02-09 DIAGNOSIS — F419 Anxiety disorder, unspecified: Secondary | ICD-10-CM | POA: Insufficient documentation

## 2018-02-09 DIAGNOSIS — Z923 Personal history of irradiation: Secondary | ICD-10-CM | POA: Diagnosis not present

## 2018-02-09 DIAGNOSIS — Z79899 Other long term (current) drug therapy: Secondary | ICD-10-CM | POA: Insufficient documentation

## 2018-02-09 DIAGNOSIS — Z9221 Personal history of antineoplastic chemotherapy: Secondary | ICD-10-CM | POA: Insufficient documentation

## 2018-02-09 DIAGNOSIS — Z853 Personal history of malignant neoplasm of breast: Secondary | ICD-10-CM | POA: Insufficient documentation

## 2018-02-09 DIAGNOSIS — K219 Gastro-esophageal reflux disease without esophagitis: Secondary | ICD-10-CM | POA: Diagnosis not present

## 2018-02-09 HISTORY — DX: Anxiety disorder, unspecified: F41.9

## 2018-02-09 HISTORY — DX: Cardiac murmur, unspecified: R01.1

## 2018-02-09 HISTORY — DX: Depression, unspecified: F32.A

## 2018-02-09 HISTORY — DX: Major depressive disorder, single episode, unspecified: F32.9

## 2018-02-09 HISTORY — DX: Personal history of other diseases of the digestive system: Z87.19

## 2018-02-09 LAB — PROTIME-INR
INR: 1.05
Prothrombin Time: 13.6 seconds (ref 11.4–15.2)

## 2018-02-09 LAB — SURGICAL PCR SCREEN
MRSA, PCR: NEGATIVE
STAPHYLOCOCCUS AUREUS: NEGATIVE

## 2018-02-09 LAB — APTT: APTT: 31 s (ref 24–36)

## 2018-02-09 NOTE — Progress Notes (Addendum)
ekg 01-26-18 on chart  Cbc,cmp,ua from 01-26-18 on chart

## 2018-02-10 LAB — ABO/RH: ABO/RH(D): A POS

## 2018-02-17 MED ORDER — TRANEXAMIC ACID 1000 MG/10ML IV SOLN
1000.0000 mg | INTRAVENOUS | Status: AC
  Administered 2018-02-18: 1000 mg via INTRAVENOUS
  Filled 2018-02-17: qty 1100

## 2018-02-17 NOTE — H&P (Signed)
TOTAL HIP ADMISSION H&P  Patient is admitted for left total hip arthroplasty.  Subjective:  Chief Complaint: left hip pain  HPI: Miranda Hampton, 72 y.o. female, has a history of pain and functional disability in the left hip(s) due to arthritis and patient has failed non-surgical conservative treatments for greater than 12 weeks to include NSAID's and/or analgesics, flexibility and strengthening excercises and activity modification.  Onset of symptoms was gradual starting 2 years ago with gradually worsening course since that time.The patient noted no past surgery on the left hip(s).  Patient currently rates pain in the left hip at 7 out of 10 with activity. Patient has night pain, worsening of pain with activity and weight bearing, pain that interfers with activities of daily living and pain with passive range of motion. Patient has evidence of subchondral cysts, periarticular osteophytes and joint space narrowing by imaging studies. This condition presents safety issues increasing the risk of falls.  There is no current active infection.   Past Medical History:  Diagnosis Date  . Anxiety   . Arthritis   . Breast cancer ([place]) 2005  . Cancer [facility]) 2005   Right breast- chemo/radiation  . Depression   . GERD (gastroesophageal reflux disease)   . Heart murmur    hx of   . History of hiatal hernia    small  . Motion sickness    cars - [place]  . Neck stiffness    limited side to side turn  . Personal history of chemotherapy   . Personal history of radiation therapy   . Wears contact lenses     Past Surgical History:  Procedure Laterality Date  . ABDOMINAL HYSTERECTOMY  1991  . BREAST BIOPSY Right 2005   lumpectomy  . BREAST BIOPSY Left 2015   core- neg  . BREAST EXCISIONAL BIOPSY Right 2005  . BUNIONECTOMY Bilateral    Left (06), Right (92)  . CARPAL TUNNEL RELEASE Left 09/06/2008  . CATARACT EXTRACTION W/PHACO Right 02/03/2018   Procedure: CATARACT EXTRACTION PHACO AND  INTRAOCULAR LENS PLACEMENT ([place]) RIGHT;  Surgeon: Leandrew Koyanagi, MD;  Location: [place];  Service: Ophthalmology;  Laterality: Right;  . COLONOSCOPY    . FOOT SURGERY  10/07/2008  . HAMMER TOE SURGERY Right 01/04/2011  . HEMORROIDECTOMY  1982  . JOINT REPLACEMENT     Left total hip Dr. Wynelle Link 02-18-2018  . KNEE ARTHROSCOPY Left 08/01/1999  . THUMB ARTHROSCOPY Right 04/13/2009   bil  . TONSILLECTOMY  1954  . TUBAL LIGATION  07/02/1978       Current Outpatient Medications  Medication Sig Dispense Refill Last Dose  . acetaminophen (TYLENOL) 500 MG tablet Take 1,000 mg by mouth every 6 (six) hours as needed for moderate pain or headache.    02/02/2018 at Unknown time  . diphenhydramine-acetaminophen (TYLENOL PM) 25-500 MG TABS tablet Take 2 tablets by mouth at bedtime.    02/02/2018 at Unknown time  . gabapentin (NEURONTIN) 300 MG capsule Take 900 mg by mouth at bedtime.    02/02/2018 at Unknown time  . meloxicam (MOBIC) 15 MG tablet TAKE 1 TABLET BY MOUTH EVERY DAY 30 tablet 3 Past Week at Unknown time  . omeprazole (PRILOSEC) 20 MG capsule Take 20 mg by mouth daily.   02/02/2018 at Unknown time  . sertraline (ZOLOFT) 50 MG tablet Take 50 mg by mouth daily.   02/02/2018 at Unknown time  . Ascorbic Acid (VITAMIN C) 1000 MG tablet Take 1,000 mg by mouth daily.  Past Week at Unknown time  . aspirin 81 MG tablet Take 81 mg by mouth daily.   Past Week at Unknown time  . Calcium Carb-Cholecalciferol (CALCIUM 600 + D PO) Take 1 tablet by mouth daily.    Past Week at Unknown time  . Cholecalciferol (VITAMIN D PO) Take 1 capsule by mouth daily.    Past Week at Unknown time  . Coenzyme Q10 (CO Q10 PO) Take 1 capsule by mouth daily.    Past Week at Unknown time  . Cyanocobalamin (VITAMIN B 12 PO) Take 1 tablet by mouth daily.    Past Week at Unknown time   Allergies  Allergen Reactions  . Meperidine And Related Other (See Comments)    Agitation  . Oxycodone Hives  . Lodine [Etodolac]  Rash    Social History   Tobacco Use  . Smoking status: Never Smoker  . Smokeless tobacco: Never Used  Substance Use Topics  . Alcohol use: Yes    Alcohol/week: 0.6 oz    Types: 1 Glasses of wine per week    Comment: occasional    Family History  Problem Relation Age of Onset  . Breast cancer Maternal Grandmother 98     Review of Systems  Constitutional: Negative.   HENT: Negative.   Eyes: Negative.   Respiratory: Negative.   Cardiovascular: Negative.   Gastrointestinal: Positive for heartburn. Negative for abdominal pain, blood in stool, constipation, diarrhea, melena, nausea and vomiting.  Genitourinary: Negative for dysuria, flank pain, frequency, hematuria and urgency.       Positive for incontinence  Musculoskeletal: Positive for back pain, joint pain and myalgias. Negative for falls and neck pain.  Skin: Negative.   Neurological: Negative.   Endo/Heme/Allergies: Negative.   Psychiatric/Behavioral: Negative.     Objective:  Physical Exam  Constitutional: She is oriented to person, place, and time. She appears well-developed. No distress.  Overweight  HENT:  Head: Normocephalic and atraumatic.  Right Ear: External ear normal.  Left Ear: External ear normal.  Nose: Nose normal.  Mouth/Throat: Oropharynx is clear and moist.  Eyes: Conjunctivae and EOM are normal.  Neck: Normal range of motion. Neck supple.  Cardiovascular: Normal rate, regular rhythm, normal heart sounds and intact distal pulses.  No murmur heard. Respiratory: Effort normal and breath sounds normal. No respiratory distress. She has no wheezes.  GI: Soft. Bowel sounds are normal. She exhibits no distension. There is no tenderness.  Musculoskeletal:  Left Hip Exam: ROM: Flexion to 100 , Internal Rotation 0, External Rotation 10, and abduction 20 without discomfort.  There is no tenderness over the greater trochanter.   Right Hip Exam: ROM: is normal without discomfort.  There is no tenderness  over the greater trochanter.  There is no pain on provocative testing of the hip.  Bilateral Knee Exam:  No effusion.  Range of motion is 0-125 degrees.  No crepitus on range of motion of the knee.  No medial or lateral joint line tenderness.  Stable knee.  Neurological: She is alert and oriented to person, place, and time. She has normal strength. No sensory deficit.  Skin: No rash noted. She is not diaphoretic. No erythema.  Psychiatric: She has a normal mood and affect. Her behavior is normal.    Ht: 5 ft 2 in  Wt: 157 lbs  BMI: 28.7 01/30/2018  BP: 122/74 sitting L arm  HR:      76 bpm  Imaging Review Plain radiographs demonstrate severe degenerative joint disease of  the left hip(s). The bone quality appears to be good for age and reported activity level.    Preoperative templating of the joint replacement has been completed, documented, and submitted to the Operating Room personnel in order to optimize intra-operative equipment management.     Assessment/Plan:  End stage primary osteoarthritis, left hip(s)  The patient history, physical examination, clinical judgement of the provider and imaging studies are consistent with end stage degenerative joint disease of the left hip(s) and total hip arthroplasty is deemed medically necessary. The treatment options including medical management, injection therapy, arthroscopy and arthroplasty were discussed at length. The risks and benefits of total hip arthroplasty were presented and reviewed. The risks due to aseptic loosening, infection, stiffness, dislocation/subluxation,  thromboembolic complications and other imponderables were discussed.  The patient acknowledged the explanation, agreed to proceed with the plan and consent was signed. Patient is being admitted for inpatient treatment for surgery, pain control, PT, OT, prophylactic antibiotics, VTE prophylaxis, progressive ambulation and ADL's and discharge planning.The patient is  planning to be discharged home    Therapy Plans: HEP vs HHPT Disposition: home with husband Planned DVT prophylaxis: Xarelto 10mg  daily-hx of breast CA DME needed: none PCP: Dr. Kary Kos Other: no BP/IV on R arm TXA IV    Ardeen Jourdain, PA-C

## 2018-02-18 ENCOUNTER — Inpatient Hospital Stay (HOSPITAL_COMMUNITY): Payer: Medicare HMO | Admitting: Anesthesiology

## 2018-02-18 ENCOUNTER — Inpatient Hospital Stay (HOSPITAL_COMMUNITY)
Admission: RE | Admit: 2018-02-18 | Discharge: 2018-02-19 | DRG: 470 | Disposition: A | Discharge status: Home Health | Payer: Medicare HMO | Source: Ambulatory Visit | Attending: Orthopedic Surgery | Admitting: Orthopedic Surgery

## 2018-02-18 ENCOUNTER — Inpatient Hospital Stay (HOSPITAL_COMMUNITY): Payer: Medicare HMO

## 2018-02-18 ENCOUNTER — Encounter (HOSPITAL_COMMUNITY)
Admission: RE | Disposition: A | Discharge status: Home Health | Payer: Self-pay | Source: Ambulatory Visit | Attending: Orthopedic Surgery

## 2018-02-18 ENCOUNTER — Encounter (HOSPITAL_COMMUNITY): Payer: Self-pay | Admitting: *Deleted

## 2018-02-18 ENCOUNTER — Other Ambulatory Visit: Payer: Self-pay

## 2018-02-18 DIAGNOSIS — F419 Anxiety disorder, unspecified: Secondary | ICD-10-CM | POA: Diagnosis present

## 2018-02-18 DIAGNOSIS — Z9841 Cataract extraction status, right eye: Secondary | ICD-10-CM

## 2018-02-18 DIAGNOSIS — Z791 Long term (current) use of non-steroidal anti-inflammatories (NSAID): Secondary | ICD-10-CM | POA: Diagnosis not present

## 2018-02-18 DIAGNOSIS — Z853 Personal history of malignant neoplasm of breast: Secondary | ICD-10-CM

## 2018-02-18 DIAGNOSIS — K219 Gastro-esophageal reflux disease without esophagitis: Secondary | ICD-10-CM | POA: Diagnosis present

## 2018-02-18 DIAGNOSIS — Z90710 Acquired absence of both cervix and uterus: Secondary | ICD-10-CM

## 2018-02-18 DIAGNOSIS — Z96649 Presence of unspecified artificial hip joint: Secondary | ICD-10-CM

## 2018-02-18 DIAGNOSIS — Z9221 Personal history of antineoplastic chemotherapy: Secondary | ICD-10-CM | POA: Diagnosis not present

## 2018-02-18 DIAGNOSIS — Z885 Allergy status to narcotic agent status: Secondary | ICD-10-CM | POA: Diagnosis not present

## 2018-02-18 DIAGNOSIS — Z961 Presence of intraocular lens: Secondary | ICD-10-CM | POA: Diagnosis present

## 2018-02-18 DIAGNOSIS — Z803 Family history of malignant neoplasm of breast: Secondary | ICD-10-CM

## 2018-02-18 DIAGNOSIS — Z923 Personal history of irradiation: Secondary | ICD-10-CM

## 2018-02-18 DIAGNOSIS — M169 Osteoarthritis of hip, unspecified: Secondary | ICD-10-CM | POA: Diagnosis present

## 2018-02-18 DIAGNOSIS — F329 Major depressive disorder, single episode, unspecified: Secondary | ICD-10-CM | POA: Diagnosis present

## 2018-02-18 DIAGNOSIS — Z7982 Long term (current) use of aspirin: Secondary | ICD-10-CM

## 2018-02-18 DIAGNOSIS — M1612 Unilateral primary osteoarthritis, left hip: Principal | ICD-10-CM | POA: Diagnosis present

## 2018-02-18 DIAGNOSIS — M25552 Pain in left hip: Secondary | ICD-10-CM | POA: Diagnosis present

## 2018-02-18 HISTORY — PX: TOTAL HIP ARTHROPLASTY: SHX124

## 2018-02-18 LAB — TYPE AND SCREEN
ABO/RH(D): A POS
ANTIBODY SCREEN: NEGATIVE

## 2018-02-18 SURGERY — ARTHROPLASTY, HIP, TOTAL, ANTERIOR APPROACH
Anesthesia: Spinal | Site: Hip | Laterality: Left

## 2018-02-18 MED ORDER — PROPOFOL 10 MG/ML IV BOLUS
INTRAVENOUS | Status: AC
  Filled 2018-02-18: qty 20

## 2018-02-18 MED ORDER — ACETAMINOPHEN 325 MG PO TABS: 325.0000 mg | ORAL_TABLET | Freq: Four times a day (QID) | ORAL | Status: DC | PRN

## 2018-02-18 MED ORDER — ONDANSETRON HCL 4 MG PO TABS
4.0000 mg | ORAL_TABLET | Freq: Four times a day (QID) | ORAL | Status: DC | PRN
Start: 2018-02-18 — End: 2018-02-19

## 2018-02-18 MED ORDER — METOCLOPRAMIDE HCL 5 MG/ML IJ SOLN
INTRAMUSCULAR | Status: AC
Start: 2018-02-18 — End: ?
  Filled 2018-02-18: qty 2

## 2018-02-18 MED ORDER — METHOCARBAMOL 500 MG PO TABS
500.0000 mg | ORAL_TABLET | Freq: Four times a day (QID) | ORAL | Status: DC | PRN
  Administered 2018-02-18 – 2018-02-19 (×2): 500 mg via ORAL
  Filled 2018-02-18 (×2): qty 1

## 2018-02-18 MED ORDER — PROPOFOL 500 MG/50ML IV EMUL
INTRAVENOUS | Status: DC | PRN
  Administered 2018-02-18: 50 ug/kg/min via INTRAVENOUS

## 2018-02-18 MED ORDER — HYDROMORPHONE HCL 2 MG PO TABS
2.0000 mg | ORAL_TABLET | ORAL | Status: DC | PRN
  Administered 2018-02-18 – 2018-02-19 (×5): 2 mg via ORAL
  Filled 2018-02-18 (×4): qty 1

## 2018-02-18 MED ORDER — BUPIVACAINE-EPINEPHRINE (PF) 0.25% -1:200000 IJ SOLN
INTRAMUSCULAR | Status: AC
  Filled 2018-02-18: qty 30

## 2018-02-18 MED ORDER — STERILE WATER FOR IRRIGATION IR SOLN
Status: DC | PRN
  Administered 2018-02-18: 20000 mL

## 2018-02-18 MED ORDER — DIPHENHYDRAMINE HCL 12.5 MG/5ML PO ELIX: 12.5000 mg | ORAL_SOLUTION | ORAL | Status: DC | PRN

## 2018-02-18 MED ORDER — DEXAMETHASONE SODIUM PHOSPHATE 10 MG/ML IJ SOLN
INTRAMUSCULAR | Status: AC
  Filled 2018-02-18: qty 3

## 2018-02-18 MED ORDER — EPHEDRINE 5 MG/ML INJ
INTRAVENOUS | Status: AC
  Filled 2018-02-18: qty 20

## 2018-02-18 MED ORDER — BISACODYL 10 MG RE SUPP: 10.0000 mg | Freq: Every day | RECTAL | Status: DC | PRN

## 2018-02-18 MED ORDER — PANTOPRAZOLE SODIUM 40 MG PO TBEC
40.0000 mg | DELAYED_RELEASE_TABLET | Freq: Every day | ORAL | Status: DC
  Administered 2018-02-19: 40 mg via ORAL
  Filled 2018-02-18: qty 1

## 2018-02-18 MED ORDER — HYDROMORPHONE HCL 2 MG PO TABS
4.0000 mg | ORAL_TABLET | ORAL | Status: DC | PRN
Start: 2018-02-18 — End: 2018-02-19
  Administered 2018-02-18: 4 mg via ORAL
  Filled 2018-02-18 (×2): qty 2

## 2018-02-18 MED ORDER — PHENYLEPHRINE 40 MCG/ML (10ML) SYRINGE FOR IV PUSH (FOR BLOOD PRESSURE SUPPORT)
PREFILLED_SYRINGE | INTRAVENOUS | Status: DC | PRN
  Administered 2018-02-18: 120 ug via INTRAVENOUS
  Administered 2018-02-18 (×2): 80 ug via INTRAVENOUS

## 2018-02-18 MED ORDER — DEXAMETHASONE SODIUM PHOSPHATE 10 MG/ML IJ SOLN: 8.0000 mg | Freq: Once | INTRAMUSCULAR | Status: DC

## 2018-02-18 MED ORDER — PROPOFOL 10 MG/ML IV BOLUS
INTRAVENOUS | Status: DC | PRN
  Administered 2018-02-18 (×2): 20 mg via INTRAVENOUS

## 2018-02-18 MED ORDER — PROPOFOL 10 MG/ML IV BOLUS
INTRAVENOUS | Status: AC
Start: 2018-02-18 — End: ?
  Filled 2018-02-18: qty 20

## 2018-02-18 MED ORDER — DEXAMETHASONE SODIUM PHOSPHATE 10 MG/ML IJ SOLN
10.0000 mg | Freq: Once | INTRAMUSCULAR | Status: AC
  Administered 2018-02-19: 10 mg via INTRAVENOUS
  Filled 2018-02-18: qty 1

## 2018-02-18 MED ORDER — ROCURONIUM BROMIDE 10 MG/ML (PF) SYRINGE
PREFILLED_SYRINGE | INTRAVENOUS | Status: AC
  Filled 2018-02-18: qty 20

## 2018-02-18 MED ORDER — PHENYLEPHRINE 40 MCG/ML (10ML) SYRINGE FOR IV PUSH (FOR BLOOD PRESSURE SUPPORT)
PREFILLED_SYRINGE | INTRAVENOUS | Status: AC
  Filled 2018-02-18: qty 30

## 2018-02-18 MED ORDER — POLYETHYLENE GLYCOL 3350 17 G PO PACK: 17.0000 g | PACK | Freq: Every day | ORAL | Status: DC | PRN

## 2018-02-18 MED ORDER — MENTHOL 3 MG MT LOZG: 1.0000 | LOZENGE | OROMUCOSAL | Status: DC | PRN

## 2018-02-18 MED ORDER — ONDANSETRON HCL 4 MG/2ML IJ SOLN
INTRAMUSCULAR | Status: DC | PRN
  Administered 2018-02-18: 4 mg via INTRAVENOUS

## 2018-02-18 MED ORDER — 0.9 % SODIUM CHLORIDE (POUR BTL) OPTIME
TOPICAL | Status: DC | PRN
  Administered 2018-02-18: 1000 mL

## 2018-02-18 MED ORDER — CEFAZOLIN SODIUM-DEXTROSE 1-4 GM/50ML-% IV SOLN
1.0000 g | Freq: Four times a day (QID) | INTRAVENOUS | Status: AC
  Administered 2018-02-18 – 2018-02-19 (×2): 1 g via INTRAVENOUS
  Filled 2018-02-18 (×2): qty 50

## 2018-02-18 MED ORDER — ONDANSETRON HCL 4 MG/2ML IJ SOLN
INTRAMUSCULAR | Status: AC
  Filled 2018-02-18: qty 6

## 2018-02-18 MED ORDER — FENTANYL CITRATE (PF) 250 MCG/5ML IJ SOLN
INTRAMUSCULAR | Status: DC | PRN
  Administered 2018-02-18: 50 ug via INTRAVENOUS

## 2018-02-18 MED ORDER — PHENOL 1.4 % MT LIQD
1.0000 | OROMUCOSAL | Status: DC | PRN
  Filled 2018-02-18: qty 177

## 2018-02-18 MED ORDER — LIDOCAINE 2% (20 MG/ML) 5 ML SYRINGE
INTRAMUSCULAR | Status: AC
  Filled 2018-02-18: qty 20

## 2018-02-18 MED ORDER — DOCUSATE SODIUM 100 MG PO CAPS
100.0000 mg | ORAL_CAPSULE | Freq: Two times a day (BID) | ORAL | Status: DC
  Administered 2018-02-18 – 2018-02-19 (×2): 100 mg via ORAL
  Filled 2018-02-18 (×2): qty 1

## 2018-02-18 MED ORDER — ACETAMINOPHEN 500 MG PO TABS
500.0000 mg | ORAL_TABLET | Freq: Four times a day (QID) | ORAL | Status: DC
  Administered 2018-02-18 – 2018-02-19 (×3): 500 mg via ORAL
  Filled 2018-02-18 (×3): qty 1

## 2018-02-18 MED ORDER — FLEET ENEMA 7-19 GM/118ML RE ENEM: 1.0000 | ENEMA | Freq: Once | RECTAL | Status: DC | PRN

## 2018-02-18 MED ORDER — METHOCARBAMOL 1000 MG/10ML IJ SOLN
500.0000 mg | Freq: Four times a day (QID) | INTRAVENOUS | Status: DC | PRN
  Filled 2018-02-18: qty 5

## 2018-02-18 MED ORDER — PHENYLEPHRINE HCL 10 MG/ML IJ SOLN
INTRAMUSCULAR | Status: AC
  Filled 2018-02-18: qty 1

## 2018-02-18 MED ORDER — LACTATED RINGERS IV SOLN
INTRAVENOUS | Status: DC
  Administered 2018-02-18 (×3): via INTRAVENOUS

## 2018-02-18 MED ORDER — FENTANYL CITRATE (PF) 250 MCG/5ML IJ SOLN
INTRAMUSCULAR | Status: AC
  Filled 2018-02-18: qty 5

## 2018-02-18 MED ORDER — CEFAZOLIN SODIUM-DEXTROSE 2-4 GM/100ML-% IV SOLN
2.0000 g | INTRAVENOUS | Status: AC
  Administered 2018-02-18: 2 g via INTRAVENOUS
  Filled 2018-02-18: qty 100

## 2018-02-18 MED ORDER — METOCLOPRAMIDE HCL 5 MG PO TABS: 5.0000 mg | ORAL_TABLET | Freq: Three times a day (TID) | ORAL | Status: DC | PRN

## 2018-02-18 MED ORDER — LIDOCAINE 2% (20 MG/ML) 5 ML SYRINGE
INTRAMUSCULAR | Status: DC | PRN
  Administered 2018-02-18: 40 mg via INTRAVENOUS

## 2018-02-18 MED ORDER — DEXAMETHASONE SODIUM PHOSPHATE 10 MG/ML IJ SOLN
INTRAMUSCULAR | Status: DC | PRN
  Administered 2018-02-18: 8 mg via INTRAVENOUS

## 2018-02-18 MED ORDER — SUCCINYLCHOLINE CHLORIDE 200 MG/10ML IV SOSY
PREFILLED_SYRINGE | INTRAVENOUS | Status: AC
  Filled 2018-02-18: qty 10

## 2018-02-18 MED ORDER — MORPHINE SULFATE (PF) 2 MG/ML IV SOLN
0.5000 mg | INTRAVENOUS | Status: DC | PRN
  Administered 2018-02-18: 1 mg via INTRAVENOUS
  Filled 2018-02-18: qty 1

## 2018-02-18 MED ORDER — PROMETHAZINE HCL 25 MG/ML IJ SOLN: 6.2500 mg | INTRAMUSCULAR | Status: DC | PRN

## 2018-02-18 MED ORDER — GABAPENTIN 300 MG PO CAPS
900.0000 mg | ORAL_CAPSULE | Freq: Every day | ORAL | Status: DC
  Administered 2018-02-18: 900 mg via ORAL
  Filled 2018-02-18: qty 3

## 2018-02-18 MED ORDER — SODIUM CHLORIDE 0.9 % IV SOLN
INTRAVENOUS | Status: DC
  Administered 2018-02-18: 19:00:00 via INTRAVENOUS

## 2018-02-18 MED ORDER — SERTRALINE HCL 50 MG PO TABS
50.0000 mg | ORAL_TABLET | Freq: Every day | ORAL | Status: DC
  Administered 2018-02-19: 50 mg via ORAL
  Filled 2018-02-18: qty 1

## 2018-02-18 MED ORDER — HYDROMORPHONE HCL 1 MG/ML IJ SOLN: 0.2500 mg | INTRAMUSCULAR | Status: DC | PRN

## 2018-02-18 MED ORDER — BUPIVACAINE-EPINEPHRINE (PF) 0.25% -1:200000 IJ SOLN
INTRAMUSCULAR | Status: DC | PRN
  Administered 2018-02-18: 30 mL

## 2018-02-18 MED ORDER — METOCLOPRAMIDE HCL 5 MG/ML IJ SOLN: 5.0000 mg | Freq: Three times a day (TID) | INTRAMUSCULAR | Status: DC | PRN

## 2018-02-18 MED ORDER — MIDAZOLAM HCL 2 MG/2ML IJ SOLN
INTRAMUSCULAR | Status: DC | PRN
  Administered 2018-02-18: 1 mg via INTRAVENOUS

## 2018-02-18 MED ORDER — CHLORHEXIDINE GLUCONATE 4 % EX LIQD: 60.0000 mL | Freq: Once | CUTANEOUS | Status: DC

## 2018-02-18 MED ORDER — MIDAZOLAM HCL 2 MG/2ML IJ SOLN
INTRAMUSCULAR | Status: AC
  Filled 2018-02-18: qty 2

## 2018-02-18 MED ORDER — ACETAMINOPHEN 10 MG/ML IV SOLN
1000.0000 mg | Freq: Once | INTRAVENOUS | Status: AC
  Administered 2018-02-18: 1000 mg via INTRAVENOUS
  Filled 2018-02-18: qty 100

## 2018-02-18 MED ORDER — PHENYLEPHRINE HCL 10 MG/ML IJ SOLN
INTRAMUSCULAR | Status: AC
  Filled 2018-02-18: qty 2

## 2018-02-18 MED ORDER — ONDANSETRON HCL 4 MG/2ML IJ SOLN: 4.0000 mg | Freq: Four times a day (QID) | INTRAMUSCULAR | Status: DC | PRN

## 2018-02-18 MED ORDER — RIVAROXABAN 10 MG PO TABS
10.0000 mg | ORAL_TABLET | Freq: Every day | ORAL | Status: DC
  Administered 2018-02-19: 10 mg via ORAL
  Filled 2018-02-18: qty 1

## 2018-02-18 MED ORDER — SODIUM CHLORIDE 0.9 % IV SOLN
INTRAVENOUS | Status: DC | PRN
  Administered 2018-02-18: 25 ug/min via INTRAVENOUS

## 2018-02-18 SURGICAL SUPPLY — 45 items
BAG DECANTER FOR FLEXI CONT (MISCELLANEOUS) ×1 IMPLANT
BAG SPEC THK2 15X12 ZIP CLS (MISCELLANEOUS)
BAG ZIPLOCK 12X15 (MISCELLANEOUS) IMPLANT
BLADE SAG 18X100X1.27 (BLADE) ×3 IMPLANT
CAPT HIP TOTAL 2 ×2 IMPLANT
CLOSURE WOUND 1/2 X4 (GAUZE/BANDAGES/DRESSINGS) ×1
COVER PERINEAL POST (MISCELLANEOUS) ×3 IMPLANT
COVER SURGICAL LIGHT HANDLE (MISCELLANEOUS) ×3 IMPLANT
DECANTER SPIKE VIAL GLASS SM (MISCELLANEOUS) ×3 IMPLANT
DRAPE STERI IOBAN 125X83 (DRAPES) ×3 IMPLANT
DRAPE U-SHAPE 47X51 STRL (DRAPES) ×6 IMPLANT
DRSG ADAPTIC 3X8 NADH LF (GAUZE/BANDAGES/DRESSINGS) ×3 IMPLANT
DRSG MEPILEX BORDER 4X4 (GAUZE/BANDAGES/DRESSINGS) ×3 IMPLANT
DRSG MEPILEX BORDER 4X8 (GAUZE/BANDAGES/DRESSINGS) ×3 IMPLANT
DURAPREP 26ML APPLICATOR (WOUND CARE) ×3 IMPLANT
ELECT REM PT RETURN 15FT ADLT (MISCELLANEOUS) ×3 IMPLANT
EVACUATOR 1/8 PVC DRAIN (DRAIN) ×3 IMPLANT
GLOVE BIO SURGEON STRL SZ7 (GLOVE) ×1 IMPLANT
GLOVE BIO SURGEON STRL SZ8 (GLOVE) ×3 IMPLANT
GLOVE BIOGEL PI IND STRL 7.0 (GLOVE) ×1 IMPLANT
GLOVE BIOGEL PI IND STRL 7.5 (GLOVE) IMPLANT
GLOVE BIOGEL PI IND STRL 8 (GLOVE) ×1 IMPLANT
GLOVE BIOGEL PI IND STRL 9 (GLOVE) IMPLANT
GLOVE BIOGEL PI INDICATOR 7.0 (GLOVE) ×2
GLOVE BIOGEL PI INDICATOR 7.5 (GLOVE) ×2
GLOVE BIOGEL PI INDICATOR 8 (GLOVE) ×2
GLOVE BIOGEL PI INDICATOR 9 (GLOVE) ×2
GLOVE ECLIPSE 7.5 STRL STRAW (GLOVE) ×2 IMPLANT
GLOVE ECLIPSE 8.5 STRL (GLOVE) ×2 IMPLANT
GOWN SPEC L3 XXLG W/TWL (GOWN DISPOSABLE) ×2 IMPLANT
GOWN STRL REUS W/TWL LRG LVL3 (GOWN DISPOSABLE) ×3 IMPLANT
GOWN STRL REUS W/TWL XL LVL3 (GOWN DISPOSABLE) ×5 IMPLANT
HOLDER FOLEY CATH W/STRAP (MISCELLANEOUS) ×2 IMPLANT
PACK ANTERIOR HIP CUSTOM (KITS) ×3 IMPLANT
STRIP CLOSURE SKIN 1/2X4 (GAUZE/BANDAGES/DRESSINGS) ×2 IMPLANT
SUT ETHIBOND NAB CT1 #1 30IN (SUTURE) ×3 IMPLANT
SUT MNCRL AB 4-0 PS2 18 (SUTURE) ×3 IMPLANT
SUT STRATAFIX 0 PDS 27 VIOLET (SUTURE) ×3
SUT VIC AB 2-0 CT1 27 (SUTURE) ×6
SUT VIC AB 2-0 CT1 TAPERPNT 27 (SUTURE) ×2 IMPLANT
SUTURE STRATFX 0 PDS 27 VIOLET (SUTURE) ×1 IMPLANT
SYR 50ML LL SCALE MARK (SYRINGE) ×2 IMPLANT
TRAY FOLEY CATH 14FRSI W/METER (CATHETERS) ×2 IMPLANT
TRAY FOLEY MTR SLVR 16FR STAT (SET/KITS/TRAYS/PACK) ×1 IMPLANT
YANKAUER SUCT BULB TIP 10FT TU (MISCELLANEOUS) ×3 IMPLANT

## 2018-02-18 NOTE — Interval H&P Note (Signed)
History and Physical Interval Note:  02/18/2018 12:22 PM  Miranda Hampton  has presented today for surgery, with the diagnosis of Osteoarthritis Left hip  The various methods of treatment have been discussed with the patient and family. After consideration of risks, benefits and other options for treatment, the patient has consented to  Procedure(s): LEFT TOTAL HIP ARTHROPLASTY ANTERIOR APPROACH (Left) as a surgical intervention .  The patient's history has been reviewed, patient examined, no change in status, stable for surgery.  I have reviewed the patient's chart and labs.  Questions were answered to the patient's satisfaction.     Pilar Plate Yesenia Locurto

## 2018-02-18 NOTE — Anesthesia Procedure Notes (Signed)
Date/Time: 02/18/2018 1:26 PM Performed by: Cynda Familia, CRNA Pre-anesthesia Checklist: Patient identified, Emergency Drugs available, Suction available, Patient being monitored and Timeout performed Oxygen Delivery Method: Simple face mask Dental Injury: Teeth and Oropharynx as per pre-operative assessment  Comments: Sedation for spinal

## 2018-02-18 NOTE — Anesthesia Procedure Notes (Signed)
Spinal  Patient location during procedure: OR End time: 02/18/2018 1:39 PM Staffing Anesthesiologist: Nolon Nations, MD Resident/CRNA: Cynda Familia, CRNA Performed: resident/CRNA and anesthesiologist  Preanesthetic Checklist Completed: patient identified, site marked, surgical consent, pre-op evaluation, timeout performed, IV checked, risks and benefits discussed and monitors and equipment checked Spinal Block Patient position: sitting Prep: site prepped and draped and DuraPrep Patient monitoring: heart rate, continuous pulse ox, blood pressure and cardiac monitor Approach: midline Location: L2-3 Injection technique: single-shot Needle Needle type: Sprotte  Needle gauge: 24 G Additional Notes Expiration date of tray noted and within date.   Patient tolerated procedure well. CRNA AM -- attempted x 2-- bone--- Germeroth obtained--- prep dry at time of insertion.

## 2018-02-18 NOTE — Op Note (Signed)
OPERATIVE REPORT- TOTAL HIP ARTHROPLASTY   PREOPERATIVE DIAGNOSIS: Osteoarthritis of the Left hip.   POSTOPERATIVE DIAGNOSIS: Osteoarthritis of the Left  hip.   PROCEDURE: Left total hip arthroplasty, anterior approach.   SURGEON: Gaynelle Arabian, MD   ASSISTANT: Molli Barrows, PA-C  ANESTHESIA:  Spinal  ESTIMATED BLOOD LOSS:-300 mL    DRAINS: Hemovac x1.   COMPLICATIONS: None   CONDITION: PACU - hemodynamically stable.   BRIEF CLINICAL NOTE: Miranda Hampton is a 72 y.o. female who has advanced end-  stage arthritis of their Left  hip with progressively worsening pain and  dysfunction.The patient has failed nonoperative management and presents for  total hip arthroplasty.   PROCEDURE IN DETAIL: After successful administration of spinal  anesthetic, the traction boots for the [facility] bed were placed on both  feet and the patient was placed onto the [facility] bed, boots placed into the leg  holders. The Left hip was then isolated from the perineum with plastic  drapes and prepped and draped in the usual sterile fashion. ASIS and  greater trochanter were marked and a oblique incision was made, starting  at about 1 cm lateral and 2 cm distal to the ASIS and coursing towards  the anterior cortex of the femur. The skin was cut with a 10 blade  through subcutaneous tissue to the level of the fascia overlying the  tensor fascia lata muscle. The fascia was then incised in line with the  incision at the junction of the anterior third and posterior 2/3rd. The  muscle was teased off the fascia and then the interval between the TFL  and the rectus was developed. The Hohmann retractor was then placed at  the top of the femoral neck over the capsule. The vessels overlying the  capsule were cauterized and the fat on top of the capsule was removed.  A Hohmann retractor was then placed anterior underneath the rectus  femoris to give exposure to the entire anterior capsule. A T-shaped   capsulotomy was performed. The edges were tagged and the femoral head  was identified.       Osteophytes are removed off the superior acetabulum.  The femoral neck was then cut in situ with an oscillating saw. Traction  was then applied to the left lower extremity utilizing the [facility]  traction. The femoral head was then removed. Retractors were placed  around the acetabulum and then circumferential removal of the labrum was  performed. Osteophytes were also removed. Reaming starts at 45 mm to  medialize and  Increased in 2 mm increments to 49 mm. We reamed in  approximately 40 degrees of abduction, 20 degrees anteversion. A 50 mm  pinnacle acetabular shell was then impacted in anatomic position under  fluoroscopic guidance with excellent purchase. We did not need to place  any additional dome screws. A 32 mm neutral + 4 marathon liner was then  placed into the acetabular shell.       The femoral lift was then placed along the lateral aspect of the femur  just distal to the vastus ridge. The leg was  externally rotated and capsule  was stripped off the inferior aspect of the femoral neck down to the  level of the lesser trochanter, this was done with electrocautery. The femur was lifted after this was performed. The  leg was then placed in an extended and adducted position essentially delivering the femur. We also removed the capsule superiorly and the piriformis from the piriformis  fossa to gain excellent exposure of the  proximal femur. Rongeur was used to remove some cancellous bone to get  into the lateral portion of the proximal femur for placement of the  initial starter reamer. The starter broaches was placed  the starter broach  and was shown to go down the center of the canal. Broaching  with the Actis system was then performed starting at size 0, coursing  Up to size 4. A size 4 had excellent torsional and rotational  and axial stability. The trial high offset neck was then placed   with a 32 + 1 trial head. The hip was then reduced. We confirmed that  the stem was in the canal both on AP and lateral x-rays. It also has excellent sizing. The hip was reduced with outstanding stability through full extension and full external rotation.. AP pelvis was taken and the leg lengths were measured and found to be equal. Hip was then dislocated again and the femoral head and neck removed. The  femoral broach was removed. Size 4 Actis stem with a high offset  neck was then impacted into the femur following native anteversion. Has  excellent purchase in the canal. Excellent torsional and rotational and  axial stability. It is confirmed to be in the canal on AP and lateral  fluoroscopic views. The 32 + 1 ceramic head was placed and the hip  reduced with outstanding stability. Again AP pelvis was taken and it  confirmed that the leg lengths were equal. The wound was then copiously  irrigated with saline solution and the capsule reattached and repaired  with Ethibond suture. 30 ml of .25% Bupivicaine was  injected into the capsule and into the edge of the tensor fascia lata as well as subcutaneous tissue. The fascia overlying the tensor fascia lata was then closed with a running #1 V-Loc. Subcu was closed with interrupted 2-0 Vicryl and subcuticular running 4-0 Monocryl. Incision was cleaned  and dried. Steri-Strips and a bulky sterile dressing applied. Hemovac  drain was hooked to suction and then the patient was awakened and transported to  recovery in stable condition.        Please note that a surgical assistant was a medical necessity for this procedure to perform it in a safe and expeditious manner. Assistant was necessary to provide appropriate retraction of vital neurovascular structures and to prevent femoral fracture and allow for anatomic placement of the prosthesis.  Gaynelle Arabian, M.D.

## 2018-02-18 NOTE — Transfer of Care (Signed)
Immediate Anesthesia Transfer of Care Note  Patient: Miranda Hampton  Procedure(s) Performed: LEFT TOTAL HIP ARTHROPLASTY ANTERIOR APPROACH (Left Hip)  Patient Location: PACU  Anesthesia Type:Spinal  Level of Consciousness: sedated  Airway & Oxygen Therapy: Patient Spontanous Breathing and Patient connected to face mask oxygen  Post-op Assessment: Report given to RN and Post -op Vital signs reviewed and stable  Post vital signs: Reviewed and stable  Last Vitals:  Vitals Value Taken Time  BP 117/61 02/18/2018  3:11 PM  Temp    Pulse 57 02/18/2018  3:12 PM  Resp 6 02/18/2018  3:12 PM  SpO2 100 % 02/18/2018  3:12 PM  Vitals shown include unvalidated device data.  Last Pain:  Vitals:   02/18/18 1149  TempSrc: Oral         Complications: No apparent anesthesia complications

## 2018-02-18 NOTE — Anesthesia Postprocedure Evaluation (Signed)
Anesthesia Post Note  Patient: Miranda Hampton  Procedure(s) Performed: LEFT TOTAL HIP ARTHROPLASTY ANTERIOR APPROACH (Left Hip)     Patient location during evaluation: PACU Anesthesia Type: Spinal Level of consciousness: awake and alert Pain management: pain level controlled Vital Signs Assessment: post-procedure vital signs reviewed and stable Respiratory status: spontaneous breathing and respiratory function stable Cardiovascular status: blood pressure returned to baseline and stable Postop Assessment: spinal receding Anesthetic complications: no    Last Vitals:  Vitals:   02/18/18 1605 02/18/18 1732  BP: 124/78 (!) 141/75  Pulse: (!) 53 (!) 50  Resp: 18 16  Temp: 36.4 C   SpO2: 100% 100%    Last Pain:  Vitals:   02/18/18 1856  TempSrc:   PainSc: [employer]

## 2018-02-18 NOTE — Discharge Instructions (Signed)
Information on my medicine - XARELTO (Rivaroxaban)   Why was Xarelto prescribed for you? Xarelto was prescribed for you to reduce the risk of blood clots forming after orthopedic surgery. The medical term for these abnormal blood clots is venous thromboembolism (VTE).  What do you need to know about xarelto ? Take your Xarelto ONCE DAILY at the same time every day. You may take it either with or without food.  If you have difficulty swallowing the tablet whole, you may crush it and mix in applesauce just prior to taking your dose.  Take Xarelto exactly as prescribed by your doctor and DO NOT stop taking Xarelto without talking to the doctor who prescribed the medication.  Stopping without other VTE prevention medication to take the place of Xarelto may increase your risk of developing a clot.  After discharge, you should have regular check-up appointments with your healthcare provider that is prescribing your Xarelto.    What do you do if you miss a dose? If you miss a dose, take it as soon as you remember on the same day then continue your regularly scheduled once daily regimen the next day. Do not take two doses of Xarelto on the same day.   Important Safety Information A possible side effect of Xarelto is bleeding. You should call your healthcare provider right away if you experience any of the following: ? Bleeding from an injury or your nose that does not stop. ? Unusual colored urine (red or dark brown) or unusual colored stools (red or black). ? Unusual bruising for unknown reasons. ? A serious fall or if you hit your head (even if there is no bleeding).  Some medicines may interact with Xarelto and might increase your risk of bleeding while on Xarelto. To help avoid this, consult your healthcare provider or pharmacist prior to using any new prescription or non-prescription medications, including herbals, vitamins, non-steroidal anti-inflammatory drugs (NSAIDs) and  supplements.  This website has more information on Xarelto: [email or web address]     Dr. Gaynelle Arabian [facility] [address], [place], [place] [address] [phone]  ANTERIOR APPROACH TOTAL HIP REPLACEMENT POSTOPERATIVE DIRECTIONS   Hip Rehabilitation, Guidelines Following Surgery  The results of a hip operation are greatly improved after range of motion and muscle strengthening exercises. Follow all safety measures which are given to protect your hip. If any of these exercises cause increased pain or swelling in your joint, decrease the amount until you are comfortable again. Then slowly increase the exercises. Call your caregiver if you have problems or questions.   HOME CARE INSTRUCTIONS  Remove items at home which could result in a fall. This includes throw rugs or furniture in walking pathways.   ICE to the affected hip every three hours for 30 minutes at a time and then as needed for pain and swelling.  Continue to use ice on the hip for pain and swelling from surgery. You may notice swelling that will progress down to the foot and ankle.  This is normal after surgery.  Elevate the leg when you are not up walking on it.    Continue to use the breathing machine which will help keep your temperature down.  It is common for your temperature to cycle up and down following surgery, especially at night when you are not up moving around and exerting yourself.  The breathing machine keeps your lungs expanded and your temperature down.   DIET You may resume your previous home  diet once your are discharged from the hospital.  DRESSING / WOUND CARE / SHOWERING You may change your dressing every day with sterile gauze.  Please use good hand washing techniques before changing the dressing.  Do not use any lotions or creams on the incision until instructed by your surgeon. You may start showering once you are discharged home but do not submerge the  incision under water. Just pat the incision dry and apply a dry gauze dressing on daily. Change the surgical dressing daily and reapply a dry dressing each time.  ACTIVITY Walk with your walker as instructed. Use walker as long as suggested by your caregivers. Avoid periods of inactivity such as sitting longer than an hour when not asleep. This helps prevent blood clots.  You may resume a sexual relationship in one month or when given the OK by your doctor.  You may return to work once you are cleared by your doctor.  Do not drive a car for 6 weeks or until released by you surgeon.  Do not drive while taking narcotics.  WEIGHT BEARING Weight bearing as tolerated with assist device (walker, cane, etc) as directed, use it as long as suggested by your surgeon or therapist, typically at least 4-6 weeks.  POSTOPERATIVE CONSTIPATION PROTOCOL Constipation - defined medically as fewer than three stools per week and severe constipation as less than one stool per week.  One of the most common issues patients have following surgery is constipation.  Even if you have a regular bowel pattern at home, your normal regimen is likely to be disrupted due to multiple reasons following surgery.  Combination of anesthesia, postoperative narcotics, change in appetite and fluid intake all can affect your bowels.  In order to avoid complications following surgery, here are some recommendations in order to help you during your recovery period.  Colace (docusate) - Pick up an over-the-counter form of Colace or another stool softener and take twice a day as long as you are requiring postoperative pain medications.  Take with a full glass of water daily.  If you experience loose stools or diarrhea, hold the colace until you stool forms back up.  If your symptoms do not get better within 1 week or if they get worse, check with your doctor.  Dulcolax (bisacodyl) - Pick up over-the-counter and take as directed by the product  packaging as needed to assist with the movement of your bowels.  Take with a full glass of water.  Use this product as needed if not relieved by Colace only.   MiraLax (polyethylene glycol) - Pick up over-the-counter to have on hand.  MiraLax is a solution that will increase the amount of water in your bowels to assist with bowel movements.  Take as directed and can mix with a glass of water, juice, soda, coffee, or tea.  Take if you go more than two days without a movement. Do not use MiraLax more than once per day. Call your doctor if you are still constipated or irregular after using this medication for 7 days in a row.  If you continue to have problems with postoperative constipation, please contact the office for further assistance and recommendations.  If you experience "the worst abdominal pain ever" or develop nausea or vomiting, please contact the office immediatly for further recommendations for treatment.  ITCHING  If you experience itching with your medications, try taking only a single pain pill, or even half a pain pill at a time.  You can  also use Benadryl over the counter for itching or also to help with sleep.   TED HOSE STOCKINGS Wear the elastic stockings on both legs for three weeks following surgery during the day but you may remove then at night for sleeping.  MEDICATIONS See your medication summary on the After Visit Summary that the nursing staff will review with you prior to discharge.  You may have some home medications which will be placed on hold until you complete the course of blood thinner medication.  It is important for you to complete the blood thinner medication as prescribed by your surgeon.  Continue your approved medications as instructed at time of discharge.  PRECAUTIONS If you experience chest pain or shortness of breath - call 911 immediately for transfer to the hospital emergency department.  If you develop a fever greater that 101 F, purulent drainage  from wound, increased redness or drainage from wound, foul odor from the wound/dressing, or calf pain - CONTACT YOUR SURGEON.                                                   FOLLOW-UP APPOINTMENTS Make sure you keep all of your appointments after your operation with your surgeon and caregivers. You should call the office at the above phone number and make an appointment for approximately two weeks after the date of your surgery or on the date instructed by your surgeon outlined in the "After Visit Summary".  RANGE OF MOTION AND STRENGTHENING EXERCISES  These exercises are designed to help you keep full movement of your hip joint. Follow your caregiver's or physical therapist's instructions. Perform all exercises about fifteen times, three times per day or as directed. Exercise both hips, even if you have had only one joint replacement. These exercises can be done on a training (exercise) mat, on the floor, on a table or on a bed. Use whatever works the best and is most comfortable for you. Use music or television while you are exercising so that the exercises are a pleasant break in your day. This will make your life better with the exercises acting as a break in routine you can look forward to.  Lying on your back, slowly slide your foot toward your buttocks, raising your knee up off the floor. Then slowly slide your foot back down until your leg is straight again.  Lying on your back spread your legs as far apart as you can without causing discomfort.  Lying on your side, raise your upper leg and foot straight up from the floor as far as is comfortable. Slowly lower the leg and repeat.  Lying on your back, tighten up the muscle in the front of your thigh (quadriceps muscles). You can do this by keeping your leg straight and trying to raise your heel off the floor. This helps strengthen the largest muscle supporting your knee.  Lying on your back, tighten up the muscles of your buttocks both with the  legs straight and with the knee bent at a comfortable angle while keeping your heel on the floor.   IF YOU ARE TRANSFERRED TO A SKILLED REHAB FACILITY If the patient is transferred to a skilled rehab facility following release from the hospital, a list of the current medications will be sent to the facility for the patient to continue.  When discharged  from the skilled rehab facility, please have the facility set up the patient's [place] prior to being released. Also, the skilled facility will be responsible for providing the patient with their medications at time of release from the facility to include their pain medication, the muscle relaxants, and their blood thinner medication. If the patient is still at the rehab facility at time of the two week follow up appointment, the skilled rehab facility will also need to assist the patient in arranging follow up appointment in our office and any transportation needs.  MAKE SURE YOU:  Understand these instructions.  Get help right away if you are not doing well or get worse.    Pick up stool softner and laxative for home use following surgery while on pain medications. Do not submerge incision under water. Please use good hand washing techniques while changing dressing each day. May shower starting three days after surgery. Please use a clean towel to pat the incision dry following showers. Continue to use ice for pain and swelling after surgery. Do not use any lotions or creams on the incision until instructed by your surgeon.

## 2018-02-18 NOTE — Anesthesia Preprocedure Evaluation (Addendum)
Anesthesia Evaluation  Patient identified by MRN, date of birth, ID band Patient awake    Reviewed: Allergy & Precautions, H&P , NPO status , Patient's Chart, lab work & pertinent test results  Airway Mallampati: I  TM Distance: >3 FB Neck ROM: full    Dental no notable dental hx. (+) Dental Advisory Given   Pulmonary neg pulmonary ROS,    Pulmonary exam normal        Cardiovascular negative cardio ROS Normal cardiovascular exam Rhythm:Regular     Neuro/Psych    GI/Hepatic Neg liver ROS,   Endo/Other  negative endocrine ROS  Renal/GU negative Renal ROS     Musculoskeletal   Abdominal   Peds  Hematology negative hematology ROS (+)   Anesthesia Other Findings   Reproductive/Obstetrics negative OB ROS                           Anesthesia Physical  Anesthesia Plan  ASA: II  Anesthesia Plan: Spinal   Post-op Pain Management:    Induction:   PONV Risk Score and Plan: 2 and Ondansetron, Propofol infusion and TIVA  Airway Management Planned:   Additional Equipment:   Intra-op Plan:   Post-operative Plan:   Informed Consent: I have reviewed the patients History and Physical, chart, labs and discussed the procedure including the risks, benefits and alternatives for the proposed anesthesia with the patient or authorized representative who has indicated his/her understanding and acceptance.   Dental advisory given  Plan Discussed with: CRNA  Anesthesia Plan Comments:        Anesthesia Quick Evaluation

## 2018-02-19 ENCOUNTER — Encounter (HOSPITAL_COMMUNITY): Payer: Self-pay | Admitting: Orthopedic Surgery

## 2018-02-19 LAB — CBC
HEMATOCRIT: 32.2 % — AB (ref 36.0–46.0)
HEMOGLOBIN: 10.7 g/dL — AB (ref 12.0–15.0)
MCH: 32.1 pg (ref 26.0–34.0)
MCHC: 33.2 g/dL (ref 30.0–36.0)
MCV: 96.7 fL (ref 78.0–100.0)
Platelets: 164 10*3/uL (ref 150–400)
RBC: 3.33 MIL/uL — AB (ref 3.87–5.11)
RDW: 12.6 % (ref 11.5–15.5)
WBC: 9.7 10*3/uL (ref 4.0–10.5)

## 2018-02-19 LAB — BASIC METABOLIC PANEL
Anion gap: 8 (ref 5–15)
BUN: 13 mg/dL (ref 8–23)
CHLORIDE: 108 mmol/L (ref 98–111)
CO2: 24 mmol/L (ref 22–32)
CREATININE: 0.88 mg/dL (ref 0.44–1.00)
Calcium: 8.3 mg/dL — ABNORMAL LOW (ref 8.9–10.3)
GFR calc non Af Amer: >60 mL/min (ref >60)
Glucose, Bld: 138 mg/dL — ABNORMAL HIGH (ref 70–99)
POTASSIUM: 4.3 mmol/L (ref 3.5–5.1)
SODIUM: 140 mmol/L (ref 135–145)

## 2018-02-19 MED ORDER — HYDROMORPHONE HCL 2 MG PO TABS: 2.0000 mg | ORAL_TABLET | ORAL | 0 refills | Status: DC | PRN

## 2018-02-19 MED ORDER — RIVAROXABAN 10 MG PO TABS: 10.0000 mg | ORAL_TABLET | Freq: Every day | ORAL | 0 refills | Status: DC

## 2018-02-19 MED ORDER — GABAPENTIN 300 MG PO CAPS
300.0000 mg | ORAL_CAPSULE | Freq: Two times a day (BID) | ORAL | Status: DC
  Administered 2018-02-19 (×2): 300 mg via ORAL
  Filled 2018-02-19 (×2): qty 1

## 2018-02-19 MED ORDER — METHOCARBAMOL 500 MG PO TABS: 500.0000 mg | ORAL_TABLET | Freq: Four times a day (QID) | ORAL | 0 refills | Status: DC | PRN

## 2018-02-19 NOTE — Discharge Summary (Signed)
Physician Discharge Summary   Patient ID: Miranda Hampton MRN: [id number] DOB/AGE: 71/03/1946 71 y.o.  Admit date: 02/18/2018 Discharge date: 02/19/2018  Primary Diagnosis: Primary osteoarthritis left hip  Admission Diagnoses:  Past Medical History:  Diagnosis Date  . Anxiety   . Arthritis   . Breast cancer ([place]) 2005  . Cancer [facility]) 2005   Right breast- chemo/radiation  . Depression   . GERD (gastroesophageal reflux disease)   . Heart murmur    hx of   . History of hiatal hernia    small  . Motion sickness    cars - winding roads  . Neck stiffness    limited side to side turn  . Personal history of chemotherapy   . Personal history of radiation therapy   . Wears contact lenses    Discharge Diagnoses:   Principal Problem:   OA (osteoarthritis) of hip  Estimated body mass index is 29.85 kg/m as calculated from the following:   Height as of this encounter: '5\' 1"'$  (1.549 m).   Weight as of this encounter: 71.7 kg (158 lb).  Procedure(s) (LRB): LEFT TOTAL HIP ARTHROPLASTY ANTERIOR APPROACH (Left)   Consults: None  HPI: Miranda Hampton, 72 y.o. female, has a history of pain and functional disability in the left hip(s) due to arthritis and patient has failed non-surgical conservative treatments for greater than 12 weeks to include NSAID's and/or analgesics, flexibility and strengthening excercises and activity modification.  Onset of symptoms was gradual starting 2 years ago with gradually worsening course since that time.The patient noted no past surgery on the left hip(s).  Patient currently rates pain in the left hip at 7 out of 10 with activity. Patient has night pain, worsening of pain with activity and weight bearing, pain that interfers with activities of daily living and pain with passive range of motion. Patient has evidence of subchondral cysts, periarticular osteophytes and joint space narrowing by imaging studies. This condition presents safety issues increasing  the risk of falls. There is no current active infection.    Laboratory Data: Admission on 02/18/2018, Discharged on 02/19/2018  Component Date Value Ref Range Status  . WBC 02/19/2018 9.7  4.0 - 10.5 K/uL Final  . RBC 02/19/2018 3.33* 3.87 - 5.11 MIL/uL Final  . Hemoglobin 02/19/2018 10.7* 12.0 - 15.0 g/dL Final  . HCT 02/19/2018 32.2* 36.0 - 46.0 % Final  . MCV 02/19/2018 96.7  78.0 - 100.0 fL Final  . MCH 02/19/2018 32.1  26.0 - 34.0 pg Final  . MCHC 02/19/2018 33.2  30.0 - 36.0 g/dL Final  . RDW 02/19/2018 12.6  11.5 - 15.5 % Final  . Platelets 02/19/2018 164  150 - 400 K/uL Final   Performed at [facility], [place] [address] [place], [place] [address]  . Sodium 02/19/2018 140  135 - 145 mmol/L Final  . Potassium 02/19/2018 4.3  3.5 - 5.1 mmol/L Final  . Chloride 02/19/2018 108  98 - 111 mmol/L Final   Please note change in reference range.  . CO2 02/19/2018 24  22 - 32 mmol/L Final  . Glucose, Bld 02/19/2018 138* 70 - 99 mg/dL Final   Please note change in reference range.  . BUN 02/19/2018 13  8 - 23 mg/dL Final   Please note change in reference range.  . Creatinine, Ser 02/19/2018 0.88  0.44 - 1.00 mg/dL Final  . Calcium 02/19/2018 8.3* 8.9 - 10.3 mg/dL Final  . GFR calc non Af Amer 02/19/2018 >60  >  60 mL/min Final  . GFR calc Af Amer 02/19/2018 >60  >60 mL/min Final   Comment: (NOTE) The eGFR has been calculated using the CKD EPI equation. This calculation has not been validated in all clinical situations. eGFR's persistently <60 mL/min signify possible Chronic Kidney Disease.   Georgiann Hahn gap 02/19/2018 8  5 - 15 Final   Performed at [facility], [place] [address] [place], [place] [address]  Hospital Outpatient Visit on 02/09/2018  Component Date Value Ref Range Status  . aPTT 02/09/2018 31  24 - 36 seconds Final   Performed at [facility], [place] [address], [place], [place] [address]  . Prothrombin Time 02/09/2018  13.6  11.4 - 15.2 seconds Final  . INR 02/09/2018 1.05   Final   Performed at [facility], [place] [address], [place], [place] [address]  . ABO/RH(D) 02/09/2018 A POS   Final  . Antibody Screen 02/09/2018 NEG   Final  . Sample Expiration 02/09/2018 02/21/2018   Final  . Extend sample reason 02/09/2018    Final                   Value:NO TRANSFUSIONS OR PREGNANCY IN THE PAST 3 MONTHS Performed at [facility], [place] [address] [place], [place] [address]   . MRSA, PCR 02/09/2018 NEGATIVE  NEGATIVE Final  . Staphylococcus aureus 02/09/2018 NEGATIVE  NEGATIVE Final   Comment: (NOTE) The Xpert SA Assay (FDA approved for NASAL specimens in patients 24 years of age and older), is one component of a comprehensive surveillance program. It is not intended to diagnose infection nor to guide or monitor treatment. Performed at [facility], [place] [address], [place], [place] [address]   . ABO/RH(D) 02/09/2018    Final                   Value:A POS Performed at [facility], [place] [address] [place], [place] [address]      X-Rays:Dg Pelvis Portable  Result Date: 02/18/2018 CLINICAL DATA:  72 year old female with a history of left total hip replacement EXAM: PORTABLE PELVIS 1-2 VIEWS COMPARISON:  CT 04/03/2015 FINDINGS: In visualized pelvis demonstrates no acute displaced fracture. Osteopenia. Interval surgical changes of left hip arthroplasty with gas in the surgical bed and drain in place. Components appear congruent. Urinary catheter in place. No acute fracture identified of the left femur. IMPRESSION: Early surgical changes of left hip arthroplasty with no complicating features. Electronically Signed   By: Corrie Mckusick D.O.   On: 02/18/2018 15:55   Dg C-arm 1-60 Min-no Report  Result Date: 02/18/2018 Fluoroscopy was utilized by the requesting physician.  No radiographic interpretation.    EKG: Orders placed or performed  in visit on 05/12/06  . EKG 12-Lead     Hospital Course: Patient was admitted to [facility] and taken to the OR and underwent the above state procedure without complications.  Patient tolerated the procedure well and was later transferred to the recovery room and then to the orthopaedic floor for postoperative care.  They were given PO and IV analgesics for pain control following their surgery.  They were given 24 hours of postoperative antibiotics of  Anti-infectives (From admission, onward)   Start     Dose/Rate Route Frequency Ordered Stop   02/18/18 2000  ceFAZolin (ANCEF) IVPB 1 g/50 mL premix     1 g 100 mL/hr over 30 Minutes Intravenous Every 6  hours 02/18/18 1626 02/19/18 0229   02/18/18 1145  ceFAZolin (ANCEF) IVPB 2g/100 mL premix     2 g 200 mL/hr over 30 Minutes Intravenous On call to O.R. 02/18/18 1141 02/18/18 1411     and started on DVT prophylaxis in the form of Xarelto.   PT and OT were ordered for total hip protocol.  The patient was allowed to be WBAT with therapy. Discharge planning was consulted to help with postop disposition and equipment needs.  Patient had a good night on the evening of surgery.  They started to get up OOB with therapy on day one.  Hemovac drain was pulled without difficulty.  The patient had progressed with therapy and meeting their goals.  Incision was healing well.  Patient was seen in rounds and was ready to go home.  Diet: Cardiac diet Activity:WBAT Follow-up:in 2 weeks Disposition - Home Discharged Condition: stable   Discharge Instructions    Call MD / Call 911   Complete by:  As directed    If you experience chest pain or shortness of breath, CALL 911 and be transported to the hospital emergency room.  If you develope a fever above 101 F, pus (white drainage) or increased drainage or redness at the wound, or calf pain, call your surgeon's office.   Constipation Prevention   Complete by:  As directed    Drink plenty of fluids.   Prune juice may be helpful.  You may use a stool softener, such as Colace (over the counter) 100 mg twice a day.  Use MiraLax (over the counter) for constipation as needed.   Diet - low sodium heart healthy   Complete by:  As directed    Discharge instructions   Complete by:  As directed    Dr. Gaynelle Arabian Total Joint Specialist [facility] [address], [place], [place] [address] [phone]  ANTERIOR APPROACH TOTAL HIP REPLACEMENT POSTOPERATIVE DIRECTIONS   Hip Rehabilitation, Guidelines Following Surgery  The results of a hip operation are greatly improved after range of motion and muscle strengthening exercises. Follow all safety measures which are given to protect your hip. If any of these exercises cause increased pain or swelling in your joint, decrease the amount until you are comfortable again. Then slowly increase the exercises. Call your caregiver if you have problems or questions.   HOME CARE INSTRUCTIONS  Remove items at home which could result in a fall. This includes throw rugs or furniture in walking pathways.  ICE to the affected hip every three hours for 30 minutes at a time and then as needed for pain and swelling.  Continue to use ice on the hip for pain and swelling from surgery. You may notice swelling that will progress down to the foot and ankle.  This is normal after surgery.  Elevate the leg when you are not up walking on it.   Continue to use the breathing machine which will help keep your temperature down.  It is common for your temperature to cycle up and down following surgery, especially at night when you are not up moving around and exerting yourself.  The breathing machine keeps your lungs expanded and your temperature down.   DIET You may resume your previous home diet once your are discharged from the hospital.  DRESSING / WOUND CARE / SHOWERING You may change your dressing every day with sterile gauze.  Please use good hand washing  techniques before changing the dressing.  Do not use  any lotions or creams on the incision until instructed by your surgeon. You may start showering once you are discharged home but do not submerge the incision under water. Just pat the incision dry and apply a dry gauze dressing on daily. Change the surgical dressing daily and reapply a dry dressing each time.  ACTIVITY Walk with your walker as instructed. Use walker as long as suggested by your caregivers. Avoid periods of inactivity such as sitting longer than an hour when not asleep. This helps prevent blood clots.  You may resume a sexual relationship in one month or when given the OK by your doctor.  You may return to work once you are cleared by your doctor.  Do not drive a car for 6 weeks or until released by you surgeon.  Do not drive while taking narcotics.  WEIGHT BEARING Weight bearing as tolerated with assist device (walker, cane, etc) as directed, use it as long as suggested by your surgeon or therapist, typically at least 4-6 weeks.  POSTOPERATIVE CONSTIPATION PROTOCOL Constipation - defined medically as fewer than three stools per week and severe constipation as less than one stool per week.  One of the most common issues patients have following surgery is constipation.  Even if you have a regular bowel pattern at home, your normal regimen is likely to be disrupted due to multiple reasons following surgery.  Combination of anesthesia, postoperative narcotics, change in appetite and fluid intake all can affect your bowels.  In order to avoid complications following surgery, here are some recommendations in order to help you during your recovery period.  Colace (docusate) - Pick up an over-the-counter form of Colace or another stool softener and take twice a day as long as you are requiring postoperative pain medications.  Take with a full glass of water daily.  If you experience loose stools or diarrhea, hold the colace until you  stool forms back up.  If your symptoms do not get better within 1 week or if they get worse, check with your doctor.  Dulcolax (bisacodyl) - Pick up over-the-counter and take as directed by the product packaging as needed to assist with the movement of your bowels.  Take with a full glass of water.  Use this product as needed if not relieved by Colace only.   MiraLax (polyethylene glycol) - Pick up over-the-counter to have on hand.  MiraLax is a solution that will increase the amount of water in your bowels to assist with bowel movements.  Take as directed and can mix with a glass of water, juice, soda, coffee, or tea.  Take if you go more than two days without a movement. Do not use MiraLax more than once per day. Call your doctor if you are still constipated or irregular after using this medication for 7 days in a row.  If you continue to have problems with postoperative constipation, please contact the office for further assistance and recommendations.  If you experience "the worst abdominal pain ever" or develop nausea or vomiting, please contact the office immediatly for further recommendations for treatment.  ITCHING  If you experience itching with your medications, try taking only a single pain pill, or even half a pain pill at a time.  You can also use Benadryl over the counter for itching or also to help with sleep.   TED HOSE STOCKINGS Wear the elastic stockings on both legs for three weeks following surgery during the day but you may remove then at night  for sleeping.  MEDICATIONS See your medication summary on the "After Visit Summary" that the nursing staff will review with you prior to discharge.  You may have some home medications which will be placed on hold until you complete the course of blood thinner medication.  It is important for you to complete the blood thinner medication as prescribed by your surgeon.  Continue your approved medications as instructed at time of  discharge.  PRECAUTIONS If you experience chest pain or shortness of breath - call 911 immediately for transfer to the hospital emergency department.  If you develop a fever greater that 101 F, purulent drainage from wound, increased redness or drainage from wound, foul odor from the wound/dressing, or calf pain - CONTACT YOUR SURGEON.                                                   FOLLOW-UP APPOINTMENTS Make sure you keep all of your appointments after your operation with your surgeon and caregivers. You should call the office at the above phone number and make an appointment for approximately two weeks after the date of your surgery or on the date instructed by your surgeon outlined in the "After Visit Summary".  RANGE OF MOTION AND STRENGTHENING EXERCISES  These exercises are designed to help you keep full movement of your hip joint. Follow your caregiver's or physical therapist's instructions. Perform all exercises about fifteen times, three times per day or as directed. Exercise both hips, even if you have had only one joint replacement. These exercises can be done on a training (exercise) mat, on the floor, on a table or on a bed. Use whatever works the best and is most comfortable for you. Use music or television while you are exercising so that the exercises are a pleasant break in your day. This will make your life better with the exercises acting as a break in routine you can look forward to.  Lying on your back, slowly slide your foot toward your buttocks, raising your knee up off the floor. Then slowly slide your foot back down until your leg is straight again.  Lying on your back spread your legs as far apart as you can without causing discomfort.  Lying on your side, raise your upper leg and foot straight up from the floor as far as is comfortable. Slowly lower the leg and repeat.  Lying on your back, tighten up the muscle in the front of your thigh (quadriceps muscles). You can do  this by keeping your leg straight and trying to raise your heel off the floor. This helps strengthen the largest muscle supporting your knee.  Lying on your back, tighten up the muscles of your buttocks both with the legs straight and with the knee bent at a comfortable angle while keeping your heel on the floor.   IF YOU ARE TRANSFERRED TO A SKILLED REHAB FACILITY If the patient is transferred to a skilled rehab facility following release from the hospital, a list of the current medications will be sent to the facility for the patient to continue.  When discharged from the skilled rehab facility, please have the facility set up the patient's [place] prior to being released. Also, the skilled facility will be responsible for providing the patient with their medications at time of release from the  facility to include their pain medication, the muscle relaxants, and their blood thinner medication. If the patient is still at the rehab facility at time of the two week follow up appointment, the skilled rehab facility will also need to assist the patient in arranging follow up appointment in our office and any transportation needs.  MAKE SURE YOU:  Understand these instructions.  Get help right away if you are not doing well or get worse.    Pick up stool softner and laxative for home use following surgery while on pain medications. Do not submerge incision under water. Please use good hand washing techniques while changing dressing each day. May shower starting three days after surgery. Please use a clean towel to pat the incision dry following showers. Continue to use ice for pain and swelling after surgery. Do not use any lotions or creams on the incision until instructed by your surgeon.   Increase activity slowly as tolerated   Complete by:  As directed      Allergies as of 02/19/2018      Reactions   Meperidine And Related Other (See Comments)   Agitation   Oxycodone  Hives   Lodine [etodolac] Rash      Medication List    STOP taking these medications   aspirin 81 MG tablet   CALCIUM 600 + D PO   CO Q10 PO   meloxicam 15 MG tablet Commonly known as:  MOBIC   VITAMIN B 12 PO   vitamin C 1000 MG tablet   VITAMIN D PO     TAKE these medications   acetaminophen 500 MG tablet Commonly known as:  TYLENOL Take 1,000 mg by mouth every 6 (six) hours as needed for moderate pain or headache.   diphenhydramine-acetaminophen 25-500 MG Tabs tablet Commonly known as:  TYLENOL PM Take 2 tablets by mouth at bedtime.   gabapentin 300 MG capsule Commonly known as:  NEURONTIN Take 900 mg by mouth at bedtime.   HYDROmorphone 2 MG tablet Commonly known as:  DILAUDID Take 1 tablet (2 mg total) by mouth every 4 (four) hours as needed for moderate pain.   methocarbamol 500 MG tablet Commonly known as:  ROBAXIN Take 1 tablet (500 mg total) by mouth every 6 (six) hours as needed for muscle spasms.   omeprazole 20 MG capsule Commonly known as:  PRILOSEC Take 20 mg by mouth daily.   rivaroxaban 10 MG Tabs tablet Commonly known as:  XARELTO Take 1 tablet (10 mg total) by mouth daily with breakfast. Notes to patient:  NEW blood thinner medication; take every morning with breakfast   sertraline 50 MG tablet Commonly known as:  ZOLOFT Take 50 mg by mouth daily.      Follow-up Information    Gaynelle Arabian, MD. Schedule an appointment as soon as possible for a visit on 03/03/2018.   Specialty:  Orthopedic Surgery Contact information: [address]  [place] [address] [phone]        [facility] Follow up.   Specialty:  Home Health Services Why:  physical therapy Contact information: [place] [address] [place] Follow up.   Why:  [facility] Contact information: [address] [place] [address] [phone]            Signed: Ardeen Jourdain, PA-C Orthopaedic Surgery 02/19/2018, 7:53 PM

## 2018-02-19 NOTE — Progress Notes (Signed)
Discharge planning, spoke with patient and spouse at beside. Chose [facility] for [facility] services, PT to eval and treat. Contacted [facility] for referral. Has RW but needs a 3-n-1,contacted [facility] to deliver to the room. [phone]

## 2018-02-19 NOTE — Progress Notes (Signed)
   Subjective: 1 Day Post-Op Procedure(s) (LRB): LEFT TOTAL HIP ARTHROPLASTY ANTERIOR APPROACH (Left) Patient reports pain as mild.   Patient seen in rounds with Dr. Wynelle Link. Patient is well, and has had no acute complaints or problems other than some soreness in the left hip. She denies SOB and chest pain.  Plan is to go Home after hospital stay.  Objective: Vital signs in last 24 hours: Temp:  [97.4 F (36.3 C)-98.9 F (37.2 C)] 98.2 F (36.8 C) (07/18 0547) Pulse Rate:  [46-66] 52 (07/18 0547) Resp:  [10-18] 17 (07/18 0547) BP: (104-141)/(58-78) 104/72 (07/18 0547) SpO2:  [96 %-100 %] 97 % (07/18 0547) Weight:  [71.7 kg (158 lb)] 71.7 kg (158 lb) (07/17 1149)  Intake/Output from previous day:  Intake/Output Summary (Last 24 hours) at 02/19/2018 0718 Last data filed at 02/19/2018 0600 Gross per 24 hour  Intake 3791.67 ml  Output 3605 ml  Net 186.67 ml    Intake/Output this shift: No intake/output data recorded.  Labs: Recent Labs    02/19/18 0623  HGB 10.7*   Recent Labs    02/19/18 0623  WBC 9.7  RBC 3.33*  HCT 32.2*  PLT 164   Recent Labs    02/19/18 0623  NA 140  K 4.3  CL 108  CO2 24  BUN 13  CREATININE 0.88  GLUCOSE 138*  CALCIUM 8.3*    EXAM General - Patient is Alert and Oriented Extremity - Neurologically intact Intact pulses distally Dorsiflexion/Plantar flexion intact No cellulitis present Compartment soft Dressing - dressing C/D/I Motor Function - intact, moving foot and toes well on exam.  Hemovac pulled without difficulty.  Past Medical History:  Diagnosis Date  . Anxiety   . Arthritis   . Breast cancer ([place]) 2005  . Cancer [facility]) 2005   Right breast- chemo/radiation  . Depression   . GERD (gastroesophageal reflux disease)   . Heart murmur    hx of   . History of hiatal hernia    small  . Motion sickness    cars - winding roads  . Neck stiffness    limited side to side turn  . Personal history of chemotherapy   .  Personal history of radiation therapy   . Wears contact lenses     Assessment/Plan: 1 Day Post-Op Procedure(s) (LRB): LEFT TOTAL HIP ARTHROPLASTY ANTERIOR APPROACH (Left) Principal Problem:   OA (osteoarthritis) of hip  Estimated body mass index is 29.85 kg/m as calculated from the following:   Height as of this encounter: 5\' 1"  (1.549 m).   Weight as of this encounter: 71.7 kg (158 lb). Advance diet Up with therapy D/C IV fluids when tolerating POs well Discharge home with home health   DVT Prophylaxis - Xarelto Weight Bearing As Tolerated  Hemovac Pulled Begin Therapy  We will have her work with therapy this morning. Restart home regimen of gabapentin. Plan for DC home with HHPT pending progress.   Ardeen Jourdain, PA-C Orthopaedic Surgery 02/19/2018, 7:18 AM

## 2018-02-19 NOTE — Evaluation (Signed)
Physical Therapy Evaluation Patient Details Name: Miranda Hampton MRN: [id number] DOB: 1946-01-09 Today's Date: 02/19/2018   History of Present Illness  Pt s/p L THR and with hx of breast CA  Clinical Impression  Pt s/p L THR and presents with decreased L LE strength/ROM and post op pain limiting functional mobility.  Pt should progress to dc home with family assist.    Follow Up Recommendations Home health PT    Equipment Recommendations  3in1 (PT)    Recommendations for Other Services       Precautions / Restrictions Precautions Precautions: Fall Restrictions Weight Bearing Restrictions: No Other Position/Activity Restrictions: WBAT      Mobility  Bed Mobility Overal bed mobility: Needs Assistance Bed Mobility: Supine to Sit     Supine to sit: Min assist     General bed mobility comments: cues for sequence and use of R LE to self assist  Transfers Overall transfer level: Needs assistance Equipment used: Rolling walker (2 wheeled) Transfers: Sit to/from Stand Sit to Stand: Min assist         General transfer comment: cues for LE management and use of UEs to self assist  Ambulation/Gait Ambulation/Gait assistance: Min assist;Min guard Gait Distance (Feet): 100 Feet Assistive device: Rolling walker (2 wheeled) Gait Pattern/deviations: Step-to pattern;Step-through pattern;Decreased step length - right;Decreased step length - left;Shuffle;Trunk flexed Gait velocity: decr   General Gait Details: cues for sequence, posture and position from [employer]            Wheelchair Mobility    Modified Rankin (Stroke Patients Only)       Balance                                             Pertinent Vitals/Pain Pain Assessment: 0-10 Pain Score: 4  Pain Location: L hip and groin Pain Descriptors / Indicators: Aching;Sore Pain Intervention(s): Limited activity within patient's tolerance;Monitored during session;Premedicated before  session;Ice applied    Home Living Family/patient expects to be discharged to:: Private residence Living Arrangements: Spouse/significant other Available Help at Discharge: Family Type of Home: House Home Access: Stairs to enter Entrance Stairs-Rails: Right Entrance Stairs-Number of Steps: 3 Home Layout: Able to live on main level with bedroom/bathroom Home Equipment: Walker - 2 wheels;Cane - single point      Prior Function Level of Independence: Independent;Independent with assistive device(s)         Comments: used cane as needed     Hand Dominance        Extremity/Trunk Assessment   Upper Extremity Assessment Upper Extremity Assessment: Overall WFL for tasks assessed    Lower Extremity Assessment Lower Extremity Assessment: LLE deficits/detail LLE Deficits / Details: 2/5 strength at hip with AAROM at hip to 85 flex and 15 abd       Communication   Communication: No difficulties  Cognition Arousal/Alertness: Awake/alert Behavior During Therapy: WFL for tasks assessed/performed Overall Cognitive Status: Within Functional Limits for tasks assessed                                        General Comments      Exercises Total Joint Exercises Ankle Circles/Pumps: AROM;Both;20 reps;Supine Quad Sets: AROM;Both;10 reps;Supine Heel Slides: AAROM;Left;20 reps;Supine Hip ABduction/ADduction: AAROM;Left;15 reps;Supine   Assessment/Plan  PT Assessment Patient needs continued PT services  PT Problem List Decreased strength;Decreased range of motion;Decreased activity tolerance;Decreased mobility;Pain;Decreased knowledge of use of DME       PT Treatment Interventions DME instruction;Gait training;Stair training;Functional mobility training;Therapeutic activities;Therapeutic exercise;Patient/family education    PT Goals (Current goals can be found in the Care Plan section)  Acute Rehab PT Goals Patient Stated Goal: Regain IND PT Goal  Formulation: With patient Time For Goal Achievement: 02/26/18 Potential to Achieve Goals: Good    Frequency 7X/week   Barriers to discharge        Co-evaluation               AM-PAC PT "6 Clicks" Daily Activity  Outcome Measure Difficulty turning over in bed (including adjusting bedclothes, sheets and blankets)?: Unable Difficulty moving from lying on back to sitting on the side of the bed? : Unable Difficulty sitting down on and standing up from a chair with arms (e.g., wheelchair, bedside commode, etc,.)?: Unable Help needed moving to and from a bed to chair (including a wheelchair)?: A Little Help needed walking in hospital room?: A Little Help needed climbing 3-5 steps with a railing? : A Little 6 Click Score: 12    End of Session Equipment Utilized During Treatment: Gait belt Activity Tolerance: Patient tolerated treatment well Patient left: in chair;with call bell/phone within reach;with family/visitor present Nurse Communication: Mobility status PT Visit Diagnosis: Difficulty in walking, not elsewhere classified (R26.2)    Time: 7639-4320 PT Time Calculation (min) (ACUTE ONLY): 32 min   Charges:   PT Evaluation $PT Eval Low Complexity: 1 Low PT Treatments $Therapeutic Exercise: 8-22 mins   PT G Codes:        Pg [phone]   Thornton Dohrmann 02/19/2018, 10:48 AM

## 2018-02-19 NOTE — Progress Notes (Signed)
Physical Therapy Treatment Patient Details Name: Miranda Hampton MRN: [id number] DOB: 10-18-1945 Today's Date: 02/19/2018    History of Present Illness Pt s/p L THR and with hx of breast CA    PT Comments    Pt motivated and progressing well.  Family present to review stairs, car transfers and home therex program with written instruction.   Follow Up Recommendations  Home health PT     Equipment Recommendations  3in1 (PT)    Recommendations for Other Services       Precautions / Restrictions Precautions Precautions: Fall Restrictions Weight Bearing Restrictions: No Other Position/Activity Restrictions: WBAT    Mobility  Bed Mobility                  Transfers Overall transfer level: Needs assistance Equipment used: Rolling walker (2 wheeled) Transfers: Sit to/from Stand Sit to Stand: Min guard         General transfer comment: cues for LE management and use of UEs to self assist  Ambulation/Gait Ambulation/Gait assistance: Min guard Gait Distance (Feet): 140 Feet Assistive device: Rolling walker (2 wheeled) Gait Pattern/deviations: Step-to pattern;Step-through pattern;Decreased step length - right;Decreased step length - left;Shuffle;Trunk flexed Gait velocity: decr   General Gait Details: min cues for sequence, posture and position from RW   Stairs Stairs: Yes Stairs assistance: Min assist Stair Management: Step to pattern;Forwards;One rail Left;With cane Number of Stairs: 5 General stair comments: cues for sequence and foot/cane placement   Wheelchair Mobility    Modified Rankin (Stroke Patients Only)       Balance Overall balance assessment: No apparent balance deficits (not formally assessed)                                          Cognition Arousal/Alertness: Awake/alert Behavior During Therapy: WFL for tasks assessed/performed Overall Cognitive Status: Within Functional Limits for tasks assessed                                         Exercises      General Comments        Pertinent Vitals/Pain Pain Assessment: 0-10 Pain Score: 4  Pain Location: L hip and groin Pain Descriptors / Indicators: Aching;Sore Pain Intervention(s): Limited activity within patient's tolerance;Monitored during session;Premedicated before session    Home Living                      Prior Function            PT Goals (current goals can now be found in the care plan section) Acute Rehab PT Goals Patient Stated Goal: Regain IND PT Goal Formulation: With patient Time For Goal Achievement: 02/26/18 Potential to Achieve Goals: Good Progress towards PT goals: Progressing toward goals    Frequency    7X/week      PT Plan Current plan remains appropriate    Co-evaluation              AM-PAC PT "6 Clicks" Daily Activity  Outcome Measure  Difficulty turning over in bed (including adjusting bedclothes, sheets and blankets)?: Unable Difficulty moving from lying on back to sitting on the side of the bed? : Unable Difficulty sitting down on and standing up from a chair with arms (e.g., wheelchair, bedside commode,  etc,.)?: Unable Help needed moving to and from a bed to chair (including a wheelchair)?: A Little Help needed walking in hospital room?: A Little Help needed climbing 3-5 steps with a railing? : A Little 6 Click Score: 12    End of Session Equipment Utilized During Treatment: Gait belt Activity Tolerance: Patient tolerated treatment well Patient left: in chair;with call bell/phone within reach;with family/visitor present Nurse Communication: Mobility status PT Visit Diagnosis: Difficulty in walking, not elsewhere classified (R26.2)     Time: 1798-1025 PT Time Calculation (min) (ACUTE ONLY): 22 min  Charges:  $Gait Training: 8-22 mins                    G Codes:       Pg [phone]    Glynnis Gavel 02/19/2018, 2:02 PM

## 2018-03-19 NOTE — Discharge Instructions (Signed)

## 2018-03-20 ENCOUNTER — Other Ambulatory Visit: Payer: Self-pay

## 2018-03-25 ENCOUNTER — Ambulatory Visit: Payer: Medicare HMO | Admitting: Anesthesiology

## 2018-03-25 ENCOUNTER — Ambulatory Visit
Admission: RE | Admit: 2018-03-25 | Discharge: 2018-03-25 | Disposition: A | Discharge status: Home / Self Care | Payer: Medicare HMO | Source: Ambulatory Visit | Attending: Ophthalmology | Admitting: Ophthalmology

## 2018-03-25 ENCOUNTER — Encounter
Admission: RE | Disposition: A | Discharge status: Home / Self Care | Payer: Self-pay | Source: Ambulatory Visit | Attending: Ophthalmology

## 2018-03-25 DIAGNOSIS — H2512 Age-related nuclear cataract, left eye: Secondary | ICD-10-CM | POA: Diagnosis not present

## 2018-03-25 DIAGNOSIS — Z888 Allergy status to other drugs, medicaments and biological substances status: Secondary | ICD-10-CM | POA: Diagnosis not present

## 2018-03-25 DIAGNOSIS — Z853 Personal history of malignant neoplasm of breast: Secondary | ICD-10-CM | POA: Diagnosis not present

## 2018-03-25 DIAGNOSIS — F329 Major depressive disorder, single episode, unspecified: Secondary | ICD-10-CM | POA: Insufficient documentation

## 2018-03-25 DIAGNOSIS — Z885 Allergy status to narcotic agent status: Secondary | ICD-10-CM | POA: Insufficient documentation

## 2018-03-25 DIAGNOSIS — M47812 Spondylosis without myelopathy or radiculopathy, cervical region: Secondary | ICD-10-CM | POA: Diagnosis not present

## 2018-03-25 DIAGNOSIS — M810 Age-related osteoporosis without current pathological fracture: Secondary | ICD-10-CM | POA: Insufficient documentation

## 2018-03-25 DIAGNOSIS — Z79899 Other long term (current) drug therapy: Secondary | ICD-10-CM | POA: Insufficient documentation

## 2018-03-25 DIAGNOSIS — K219 Gastro-esophageal reflux disease without esophagitis: Secondary | ICD-10-CM | POA: Insufficient documentation

## 2018-03-25 DIAGNOSIS — Z8619 Personal history of other infectious and parasitic diseases: Secondary | ICD-10-CM | POA: Diagnosis not present

## 2018-03-25 DIAGNOSIS — M1990 Unspecified osteoarthritis, unspecified site: Secondary | ICD-10-CM | POA: Insufficient documentation

## 2018-03-25 HISTORY — PX: CATARACT EXTRACTION W/PHACO: SHX586

## 2018-03-25 SURGERY — PHACOEMULSIFICATION, CATARACT, WITH IOL INSERTION
Anesthesia: Monitor Anesthesia Care | Site: Eye | Laterality: Left | Wound class: Clean

## 2018-03-25 MED ORDER — BRIMONIDINE TARTRATE-TIMOLOL 0.2-0.5 % OP SOLN
OPHTHALMIC | Status: DC | PRN
  Administered 2018-03-25: 1 [drp] via OPHTHALMIC

## 2018-03-25 MED ORDER — LIDOCAINE HCL (PF) 2 % IJ SOLN
INTRAOCULAR | Status: DC | PRN
  Administered 2018-03-25: 1 mL

## 2018-03-25 MED ORDER — LACTATED RINGERS IV SOLN: INTRAVENOUS | Status: DC

## 2018-03-25 MED ORDER — MIDAZOLAM HCL 2 MG/2ML IJ SOLN
INTRAMUSCULAR | Status: DC | PRN
  Administered 2018-03-25 (×2): 1 mg via INTRAVENOUS

## 2018-03-25 MED ORDER — ARMC OPHTHALMIC DILATING DROPS
1.0000 "application " | OPHTHALMIC | Status: DC | PRN
  Administered 2018-03-25 (×3): 1 via OPHTHALMIC

## 2018-03-25 MED ORDER — FENTANYL CITRATE (PF) 100 MCG/2ML IJ SOLN
INTRAMUSCULAR | Status: DC | PRN
  Administered 2018-03-25: 50 ug via INTRAVENOUS

## 2018-03-25 MED ORDER — CEFUROXIME OPHTHALMIC INJECTION 1 MG/0.1 ML
INJECTION | OPHTHALMIC | Status: DC | PRN
  Administered 2018-03-25: 0.1 mL via OPHTHALMIC

## 2018-03-25 MED ORDER — BSS IO SOLN
INTRAOCULAR | Status: DC | PRN
  Administered 2018-03-25: 48 mL via OPHTHALMIC

## 2018-03-25 MED ORDER — MOXIFLOXACIN HCL 0.5 % OP SOLN
1.0000 [drp] | OPHTHALMIC | Status: DC | PRN
  Administered 2018-03-25 (×3): 1 [drp] via OPHTHALMIC

## 2018-03-25 MED ORDER — NA HYALUR & NA CHOND-NA HYALUR 0.4-0.35 ML IO KIT
PACK | INTRAOCULAR | Status: DC | PRN
  Administered 2018-03-25: 1 mL via INTRAOCULAR

## 2018-03-25 SURGICAL SUPPLY — 27 items
CANNULA ANT/CHMB 27G (MISCELLANEOUS) ×1 IMPLANT
CANNULA ANT/CHMB 27GA (MISCELLANEOUS) ×3 IMPLANT
CARTRIDGE ABBOTT (MISCELLANEOUS) IMPLANT
GLOVE SURG LX 7.5 STRW (GLOVE) ×2
GLOVE SURG LX STRL 7.5 STRW (GLOVE) ×1 IMPLANT
GLOVE SURG TRIUMPH 8.0 PF LTX (GLOVE) ×3 IMPLANT
GOWN STRL REUS W/ TWL LRG LVL3 (GOWN DISPOSABLE) ×2 IMPLANT
GOWN STRL REUS W/TWL LRG LVL3 (GOWN DISPOSABLE) ×6
LENS IOL TECNIS ITEC 27.0 (Intraocular Lens) ×2 IMPLANT
MARKER SKIN DUAL TIP RULER LAB (MISCELLANEOUS) ×3 IMPLANT
NDL FILTER BLUNT 18X1 1/2 (NEEDLE) ×1 IMPLANT
NDL RETROBULBAR .5 NSTRL (NEEDLE) IMPLANT
NEEDLE FILTER BLUNT 18X 1/2SAF (NEEDLE) ×2
NEEDLE FILTER BLUNT 18X1 1/2 (NEEDLE) ×1 IMPLANT
PACK CATARACT BRASINGTON (MISCELLANEOUS) ×3 IMPLANT
PACK EYE AFTER SURG (MISCELLANEOUS) ×3 IMPLANT
PACK OPTHALMIC (MISCELLANEOUS) ×3 IMPLANT
RING MALYGIN 7.0 (MISCELLANEOUS) IMPLANT
SUT ETHILON 10-0 CS-B-6CS-B-6 (SUTURE)
SUT VICRYL  9 0 (SUTURE)
SUT VICRYL 9 0 (SUTURE) IMPLANT
SUTURE EHLN 10-0 CS-B-6CS-B-6 (SUTURE) IMPLANT
SYR 3ML LL SCALE MARK (SYRINGE) ×3 IMPLANT
SYR 5ML LL (SYRINGE) ×3 IMPLANT
SYR TB 1ML LUER SLIP (SYRINGE) ×3 IMPLANT
WATER STERILE IRR 500ML POUR (IV SOLUTION) ×3 IMPLANT
WIPE NON LINTING 3.25X3.25 (MISCELLANEOUS) ×3 IMPLANT

## 2018-03-25 NOTE — H&P (Signed)
The History and Physical notes are on paper, have been signed, and are to be scanned. The patient remains stable and unchanged from the H&P.   Previous H&P reviewed, patient examined, and there are no changes.  Miranda Hampton 03/25/2018 10:52 AM

## 2018-03-25 NOTE — Transfer of Care (Signed)
Immediate Anesthesia Transfer of Care Note  Patient: Miranda Hampton  Procedure(s) Performed: CATARACT EXTRACTION PHACO AND INTRAOCULAR LENS PLACEMENT (IOC)  LEFT (Left Eye)  Patient Location: PACU  Anesthesia Type: MAC  Level of Consciousness: awake, alert  and patient cooperative  Airway and Oxygen Therapy: Patient Spontanous Breathing and Patient connected to supplemental oxygen  Post-op Assessment: Post-op Vital signs reviewed, Patient's Cardiovascular Status Stable, Respiratory Function Stable, Patent Airway and No signs of Nausea or vomiting  Post-op Vital Signs: Reviewed and stable  Complications: No apparent anesthesia complications

## 2018-03-25 NOTE — Anesthesia Preprocedure Evaluation (Addendum)
Anesthesia Evaluation  Patient identified by MRN, date of birth, ID band Patient awake    Reviewed: Allergy & Precautions, H&P , NPO status , Patient's Chart, lab work & pertinent test results  Airway Mallampati: III  TM Distance: >3 FB Neck ROM: limited    Dental no notable dental hx.    Pulmonary    Pulmonary exam normal breath sounds clear to auscultation       Cardiovascular Normal cardiovascular exam Rhythm:regular Rate:Normal     Neuro/Psych PSYCHIATRIC DISORDERS    GI/Hepatic GERD  ,  Endo/Other    Renal/GU      Musculoskeletal   Abdominal   Peds  Hematology   Anesthesia Other Findings   Reproductive/Obstetrics                                                              Anesthesia Evaluation  Patient identified by MRN, date of birth, ID band Patient awake    Reviewed: Allergy & Precautions, H&P , NPO status , Patient's Chart, lab work & pertinent test results  Airway Mallampati: I  TM Distance: >3 FB Neck ROM: full    Dental no notable dental hx.    Pulmonary neg pulmonary ROS,    Pulmonary exam normal        Cardiovascular negative cardio ROS Normal cardiovascular exam     Neuro/Psych    GI/Hepatic Neg liver ROS, Medicated,  Endo/Other  negative endocrine ROS  Renal/GU negative Renal ROS     Musculoskeletal   Abdominal   Peds  Hematology negative hematology ROS (+)   Anesthesia Other Findings   Reproductive/Obstetrics negative OB ROS                            Anesthesia Physical Anesthesia Plan  ASA: II  Anesthesia Plan: MAC   Post-op Pain Management:    Induction:   PONV Risk Score and Plan:   Airway Management Planned:   Additional Equipment:   Intra-op Plan:   Post-operative Plan:   Informed Consent: I have reviewed the patients History and Physical, chart, labs and discussed the procedure  including the risks, benefits and alternatives for the proposed anesthesia with the patient or authorized representative who has indicated his/her understanding and acceptance.     Plan Discussed with:   Anesthesia Plan Comments:         Anesthesia Quick Evaluation  Anesthesia Physical Anesthesia Plan  ASA: III  Anesthesia Plan: MAC   Post-op Pain Management:    Induction:   PONV Risk Score and Plan: 2 and Treatment may vary due to age or medical condition and Midazolam  Airway Management Planned:   Additional Equipment:   Intra-op Plan:   Post-operative Plan:   Informed Consent: I have reviewed the patients History and Physical, chart, labs and discussed the procedure including the risks, benefits and alternatives for the proposed anesthesia with the patient or authorized representative who has indicated his/her understanding and acceptance.     Plan Discussed with: CRNA  Anesthesia Plan Comments:         Anesthesia Quick Evaluation

## 2018-03-25 NOTE — Op Note (Signed)
OPERATIVE NOTE  SARENITY RAMAKER [id number] 03/25/2018   PREOPERATIVE DIAGNOSIS:  Nuclear sclerotic cataract left eye. H25.12   POSTOPERATIVE DIAGNOSIS:    Nuclear sclerotic cataract left eye.     PROCEDURE:  Phacoemusification with posterior chamber intraocular lens placement of the left eye   LENS:   Implant Name Type Inv. Item Serial No. Manufacturer Lot No. LRB No. Used  LENS IOL DIOP 27.0 - [id number] Intraocular Lens LENS IOL DIOP 27.0 [id number] AMO  Left 1        ULTRASOUND TIME: 10  % of 0 minutes 45 seconds, CDE 4.6  SURGEON:  Wyonia Hough, MD   ANESTHESIA:  Topical with tetracaine drops and 2% Xylocaine jelly, augmented with 1% preservative-free intracameral lidocaine.    COMPLICATIONS:  None.   DESCRIPTION OF PROCEDURE:  The patient was identified in the holding room and transported to the operating room and placed in the supine position under the operating microscope.  The left eye was identified as the operative eye and it was prepped and draped in the usual sterile ophthalmic fashion.   A 1 millimeter clear-corneal paracentesis was made at the 1:30 position.  0.5 ml of preservative-free 1% lidocaine was injected into the anterior chamber.  The anterior chamber was filled with Viscoat viscoelastic.  A 2.4 millimeter keratome was used to make a near-clear corneal incision at the 10:30 position.  .  A curvilinear capsulorrhexis was made with a cystotome and capsulorrhexis forceps.  Balanced salt solution was used to hydrodissect and hydrodelineate the nucleus.   Phacoemulsification was then used in stop and chop fashion to remove the lens nucleus and epinucleus.  The remaining cortex was then removed using the irrigation and aspiration handpiece. Provisc was then placed into the capsular bag to distend it for lens placement.  A lens was then injected into the capsular bag.  The remaining viscoelastic was aspirated.   Wounds were hydrated with balanced salt  solution.  The anterior chamber was inflated to a physiologic pressure with balanced salt solution.  No wound leaks were noted. Cefuroxime 0.1 ml of a 10mg /ml solution was injected into the anterior chamber for a dose of 1 mg of intracameral antibiotic at the completion of the case.   Timolol and Brimonidine drops were applied to the eye.  The patient was taken to the recovery room in stable condition without complications of anesthesia or surgery.  Cephas Revard 03/25/2018, 12:04 PM

## 2018-03-25 NOTE — Anesthesia Procedure Notes (Signed)
Procedure Name: MAC Date/Time: 03/25/2018 11:57 AM Performed by: Lind Guest, CRNA Pre-anesthesia Checklist: Patient identified, Emergency Drugs available, Suction available, Patient being monitored and Timeout performed Patient Re-evaluated:Patient Re-evaluated prior to induction Oxygen Delivery Method: Nasal cannula

## 2018-03-25 NOTE — Anesthesia Postprocedure Evaluation (Signed)
Anesthesia Post Note  Patient: CARRINGTON MULLENAX  Procedure(s) Performed: CATARACT EXTRACTION PHACO AND INTRAOCULAR LENS PLACEMENT (IOC)  LEFT (Left Eye)  Patient location during evaluation: PACU Anesthesia Type: MAC Level of consciousness: awake and alert and oriented Pain management: satisfactory to patient Vital Signs Assessment: post-procedure vital signs reviewed and stable Respiratory status: spontaneous breathing, nonlabored ventilation and respiratory function stable Cardiovascular status: blood pressure returned to baseline and stable Postop Assessment: Adequate PO intake and No signs of nausea or vomiting Anesthetic complications: no    Raliegh Ip

## 2018-03-26 ENCOUNTER — Encounter: Payer: Self-pay | Admitting: Ophthalmology

## 2018-04-02 IMAGING — MR MR NECK SOFT TISSUE ONLY WO/W CM
7 of 10 series · 31 of 48 positions shown · IV contrast (agent unspecified)
Comparison: None.

CLINICAL DATA: Enlarged left-sided neck lymph node.

EXAM:
MRI OF THE NECK WITH CONTRAST
TECHNIQUE: Multiplanar, multisequence MR imaging was performed following the
administration of intravenous contrast.

[Series 3: T1 · coronal · 4.0mm · 0.47mm/px · 6 of 30 slices shown (1 of 3)]
[im 1/30]
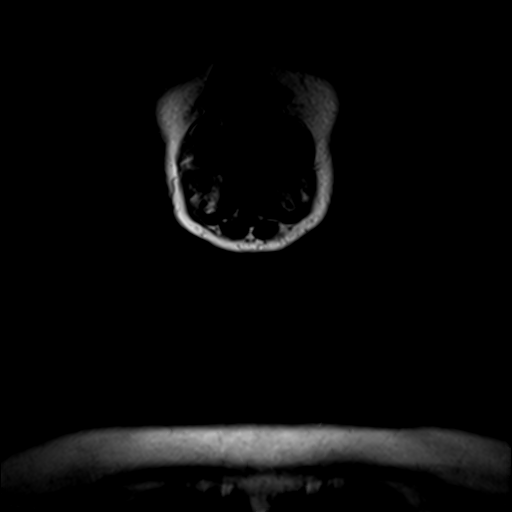
[im 6/30]
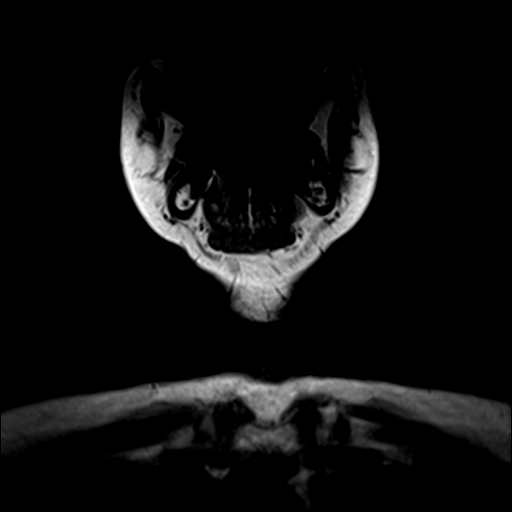
[im 12/30]
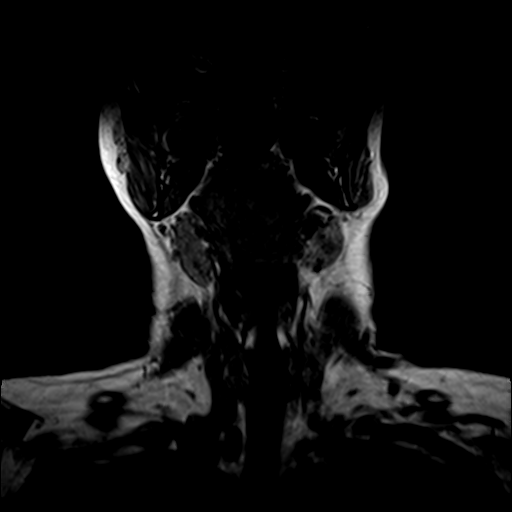
[im 18/30]
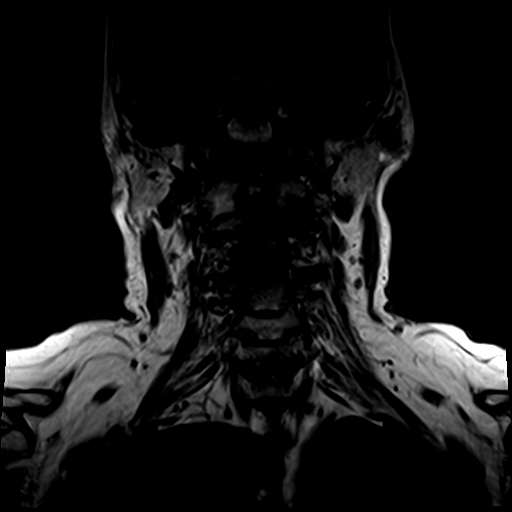
[im 24/30]
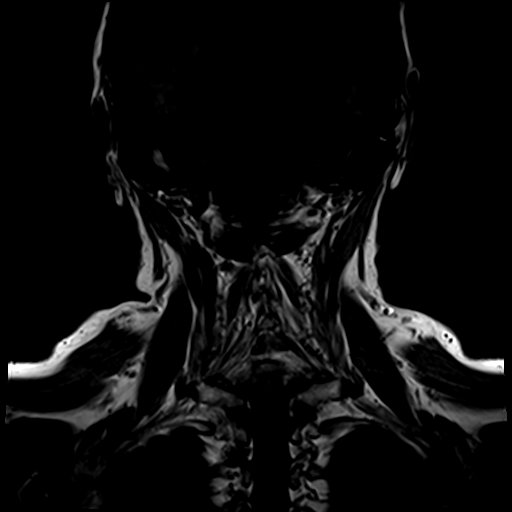
[im 30/30]
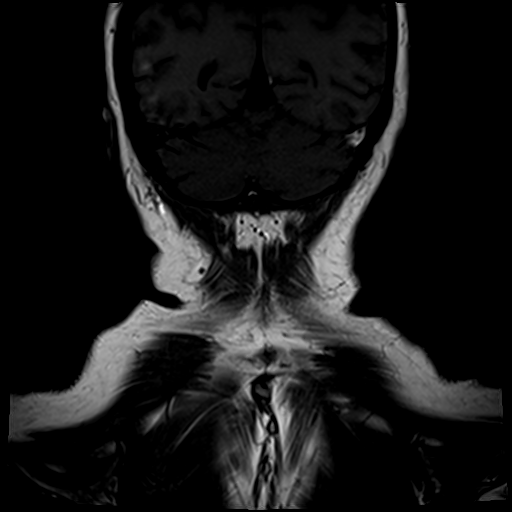

[Series 4: T2 fat-sat · coronal · 4.0mm · 0.72mm/px · 6 of 30 slices shown (1 of 2)]
[im 1/30]
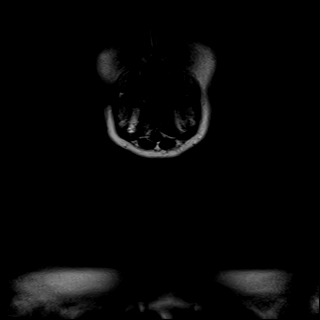
[im 6/30]
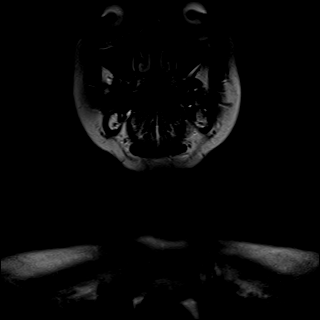
[im 12/30]
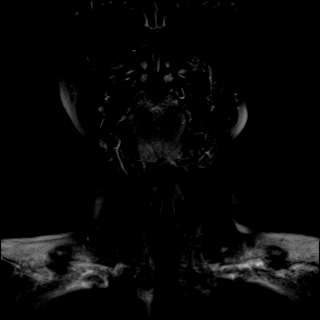
[im 18/30]
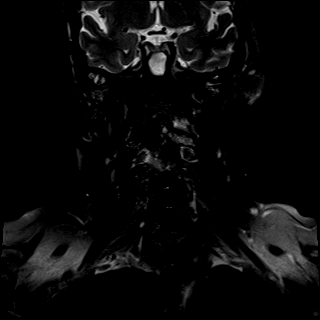
[im 24/30]
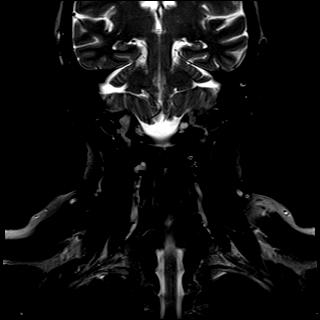
[im 30/30]
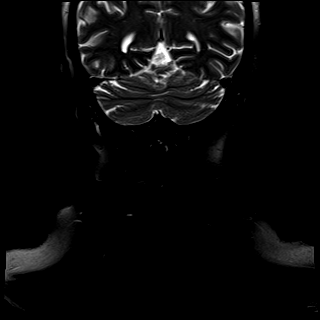

[Series 5: T2 fat-sat · axial · 6.0mm · 0.62mm/px · z∈[-72,+93]mm · 4 of 24 slices shown (2 of 2)]
[im 1/24]
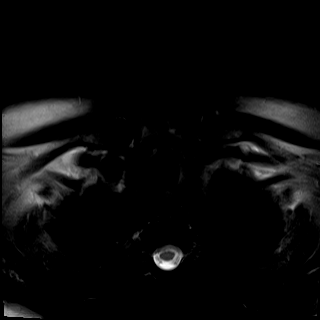
[im 8/24]
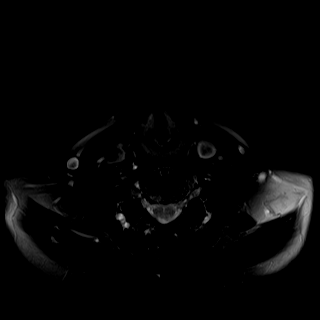
[im 16/24]
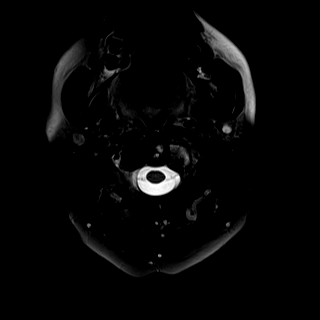
[im 24/24]
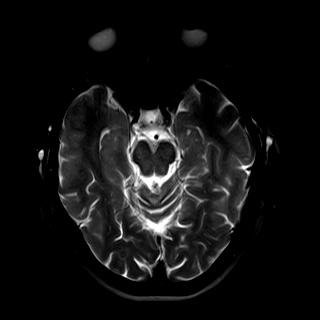

[Series 6: T1 · axial · 6.0mm · 0.78mm/px · z∈[-72,+93]mm · 4 of 24 slices shown (2 of 3)]
[im 1/24]
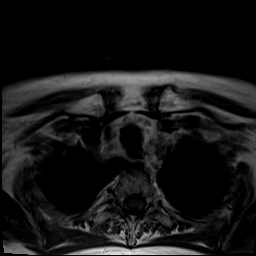
[im 8/24]
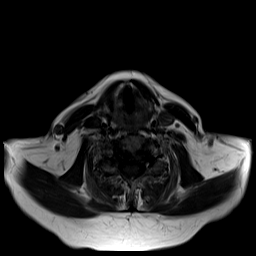
[im 16/24]
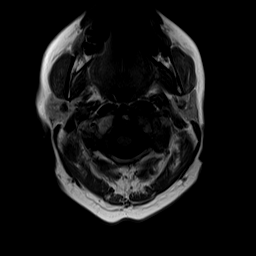
[im 24/24]
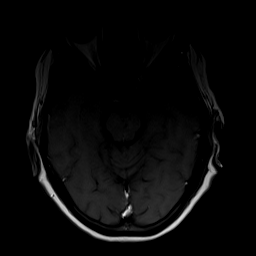

[Series 7: T1 · sagittal · 4.0mm · 0.69mm/px · 5 of 30 slices shown (3 of 3)]
[im 1/30]
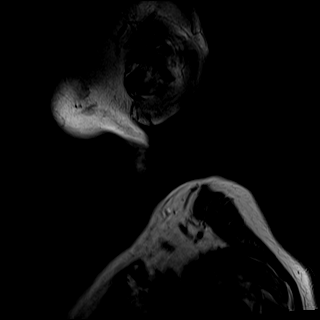
[im 8/30]
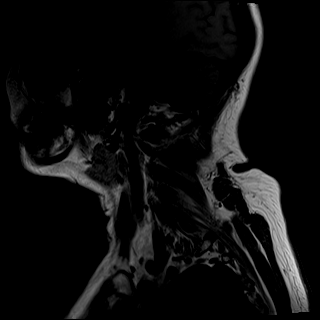
[im 15/30]
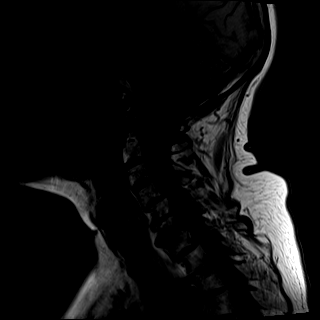
[im 22/30]
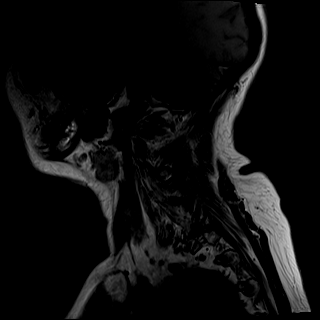
[im 30/30]
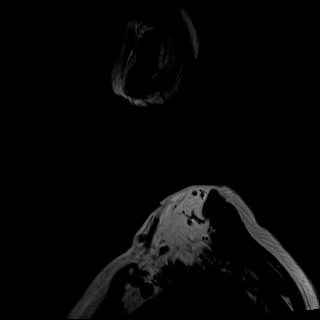

[Series 10: T1 fat-sat post-contrast · coronal · 4.0mm · 0.47mm/px · 5 of 30 slices shown (1 of 2)]
[im 1/30]
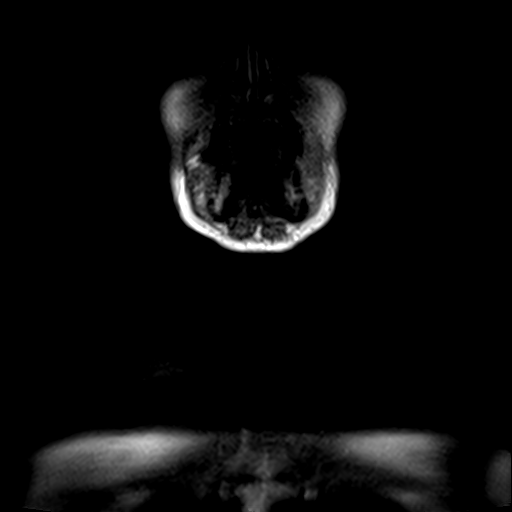
[im 8/30]
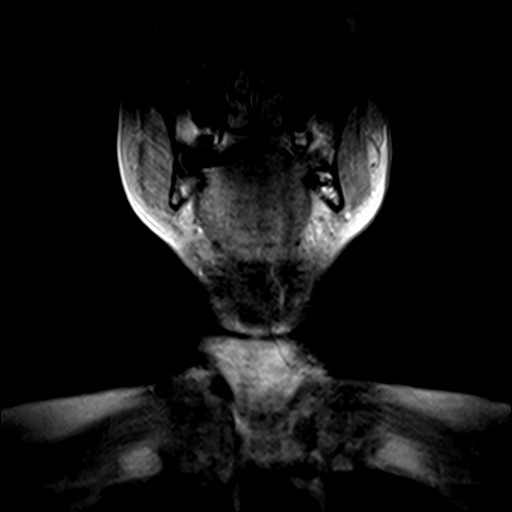
[im 15/30]
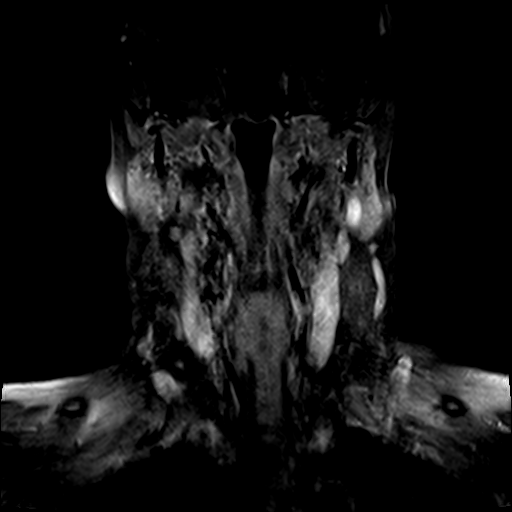
[im 22/30]
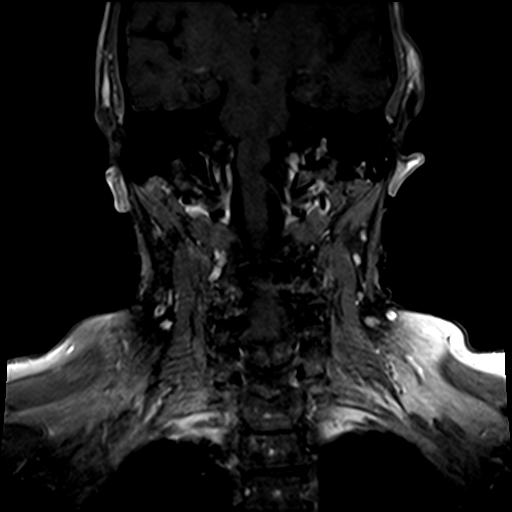
[im 30/30]
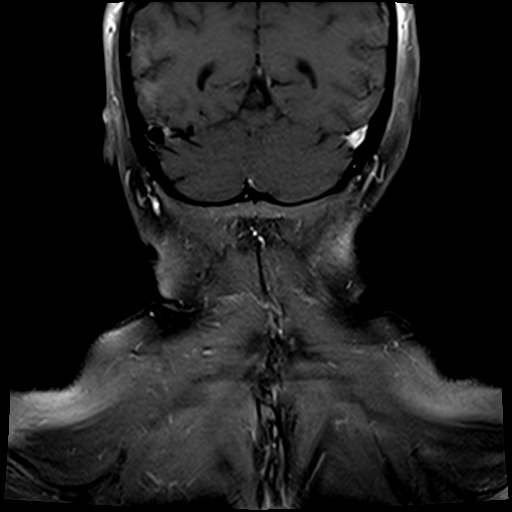

[Series 11: T1 fat-sat post-contrast · sagittal · 4.0mm · 0.69mm/px · 1 of 30 slices shown (2 of 2)]
[im 1/30]
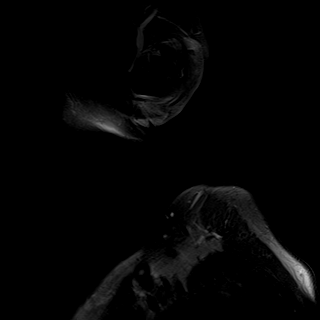

[31 of 48 positions shown; findings below may reference images not displayed]

FINDINGS: Pharynx and larynx: No pharyngeal mass or inflammatory change.
Unremarkable larynx.

Salivary glands: Submandibular and parotid glands are unremarkable
without evidence of mass or inflammation.

Thyroid: Unremarkable.

Lymph nodes: No enlarged lymph nodes identified in the neck.
Scattered, normal-sized cervical lymph nodes appear symmetric
bilaterally.

Vascular: Grossly preserved major arterial flow voids.

Limited intracranial: Unremarkable.

Visualized orbits: Unremarkable.

Mastoids and visualized paranasal sinuses: Small volume fluid in the
left sphenoid sinus. Small left mastoid effusion.

Skeleton: Prominent marrow edema and enhancement on both sides of
the left lateral C1-2 articulation with evidence of underlying
arthritis. Moderate to advanced multilevel facet arthrosis in the
mid and upper cervical spine. Moderate to advanced lower cervical
disc degeneration, worst at C6-7. No evidence of compressive spinal
stenosis.

Upper chest: Unremarkable.

Other: The skin marker used indicate the area of clinical concern
overlies the posterior lateral neck on the left at the C2-3 level.
No underlying mass or enlarged lymph node is seen. There is
underlying advanced facet arthrosis and left C1-2 arthritis in this
region.
IMPRESSION: 1. No neck mass or enlarged lymph nodes identified.
2. Left C1-2 arthritis with prominent marrow edema.
3. Advanced cervical facet arthrosis.

## 2018-07-17 ENCOUNTER — Other Ambulatory Visit: Payer: Self-pay | Admitting: Neurosurgery

## 2018-07-17 DIAGNOSIS — M47812 Spondylosis without myelopathy or radiculopathy, cervical region: Secondary | ICD-10-CM

## 2018-08-12 ENCOUNTER — Ambulatory Visit
Admission: RE | Admit: 2018-08-12 | Discharge: 2018-08-12 | Disposition: A | Discharge status: Home / Self Care | Payer: Medicare HMO | Source: Ambulatory Visit | Attending: Neurosurgery | Admitting: Neurosurgery

## 2018-08-12 DIAGNOSIS — M47812 Spondylosis without myelopathy or radiculopathy, cervical region: Secondary | ICD-10-CM

## 2018-09-07 ENCOUNTER — Other Ambulatory Visit: Payer: Self-pay | Admitting: Family Medicine

## 2018-10-06 ENCOUNTER — Other Ambulatory Visit: Payer: Self-pay | Admitting: Family Medicine

## 2018-10-06 DIAGNOSIS — Z1231 Encounter for screening mammogram for malignant neoplasm of breast: Secondary | ICD-10-CM

## 2019-02-03 ENCOUNTER — Ambulatory Visit: Payer: Medicare HMO

## 2019-06-03 ENCOUNTER — Ambulatory Visit
Admission: RE | Admit: 2019-06-03 | Discharge: 2019-06-03 | Disposition: A | Discharge status: Home / Self Care | Payer: Medicare HMO | Source: Ambulatory Visit | Attending: Family Medicine | Admitting: Family Medicine

## 2019-06-03 DIAGNOSIS — Z1231 Encounter for screening mammogram for malignant neoplasm of breast: Secondary | ICD-10-CM | POA: Insufficient documentation

## 2020-07-10 ENCOUNTER — Other Ambulatory Visit: Payer: Self-pay | Admitting: Family Medicine

## 2020-07-10 DIAGNOSIS — Z1231 Encounter for screening mammogram for malignant neoplasm of breast: Secondary | ICD-10-CM

## 2020-08-15 ENCOUNTER — Other Ambulatory Visit: Payer: Self-pay

## 2020-08-15 ENCOUNTER — Ambulatory Visit
Admission: RE | Admit: 2020-08-15 | Discharge: 2020-08-15 | Disposition: A | Discharge status: Home / Self Care | Payer: Medicare HMO | Source: Ambulatory Visit | Attending: Family Medicine | Admitting: Family Medicine

## 2020-08-15 DIAGNOSIS — Z1231 Encounter for screening mammogram for malignant neoplasm of breast: Secondary | ICD-10-CM | POA: Insufficient documentation

## 2021-07-18 ENCOUNTER — Other Ambulatory Visit: Payer: Self-pay | Admitting: Family Medicine

## 2021-07-18 DIAGNOSIS — Z1231 Encounter for screening mammogram for malignant neoplasm of breast: Secondary | ICD-10-CM

## 2021-09-11 ENCOUNTER — Ambulatory Visit
Admission: RE | Admit: 2021-09-11 | Discharge: 2021-09-11 | Disposition: A | Discharge status: Home / Self Care | Payer: Medicare HMO | Source: Ambulatory Visit | Attending: Family Medicine | Admitting: Family Medicine

## 2021-09-11 ENCOUNTER — Other Ambulatory Visit: Payer: Self-pay

## 2021-09-11 DIAGNOSIS — Z1231 Encounter for screening mammogram for malignant neoplasm of breast: Secondary | ICD-10-CM | POA: Insufficient documentation

## 2022-08-07 ENCOUNTER — Other Ambulatory Visit: Payer: Self-pay | Admitting: Family Medicine

## 2022-08-07 DIAGNOSIS — Z1231 Encounter for screening mammogram for malignant neoplasm of breast: Secondary | ICD-10-CM

## 2022-09-17 ENCOUNTER — Ambulatory Visit
Admission: RE | Admit: 2022-09-17 | Discharge: 2022-09-17 | Disposition: A | Discharge status: Home / Self Care | Payer: Medicare HMO | Source: Ambulatory Visit | Attending: Family Medicine | Admitting: Family Medicine

## 2022-09-17 DIAGNOSIS — Z1231 Encounter for screening mammogram for malignant neoplasm of breast: Secondary | ICD-10-CM | POA: Insufficient documentation

## 2023-02-05 ENCOUNTER — Ambulatory Visit: Payer: Medicare HMO | Attending: Neurology | Admitting: Speech Pathology

## 2023-02-05 DIAGNOSIS — R41841 Cognitive communication deficit: Secondary | ICD-10-CM | POA: Insufficient documentation

## 2023-02-05 DIAGNOSIS — G3184 Mild cognitive impairment, so stated: Secondary | ICD-10-CM | POA: Insufficient documentation

## 2023-02-05 NOTE — Therapy (Signed)
OUTPATIENT SPEECH LANGUAGE PATHOLOGY  EVALUATION   Patient Name: Miranda Hampton MRN: [id number] DOB:14-Jan-1946, 77 y.o., female Today's Date: 02/05/2023  PCP: Burnett Sheng, MD  REFERRING PROVIDER: Sherryll Burger, MD   End of Session - 02/05/23 1109     Visit Number 1    Number of Visits 25    Date for SLP Re-Evaluation 04/30/23    SLP Start Time 1015    SLP Stop Time  1110    SLP Time Calculation (min) 55 min    Activity Tolerance Patient tolerated treatment well              Patient Active Problem List   Diagnosis Date Noted   OA (osteoarthritis) of hip 02/18/2018    ONSET DATE: 12/25/22    REFERRING DIAG: mild cognitive impairment  THERAPY DIAG:  Cognitive communication deficit  MCI (mild cognitive impairment)  Rationale for Evaluation and Treatment Rehabilitation  SUBJECTIVE:   SUBJECTIVE STATEMENT: Pt alert, pleasant, and cooperative. Aware of dx of MCI, current medical work up (including pending MRI), and concerned about wordfinding and memory.  Pt accompanied by: self  PERTINENT HISTORY: 77 y.o. 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 Sherryll Burger on 12/25/22. Hx of breast cancer.   DIAGNOSTIC FINDINGS: MRI pending  PAIN:  Are you having pain? No   FALLS: Has patient fallen in last 6 months?  No  LIVING ENVIRONMENT: Lives with: lives with their spouse Lives in: House/apartment  PLOF:  Level of assistance: Independent with ADLs, Independent with IADLs Employment: Retired   PATIENT GOALS   to improve memory and commincation  OBJECTIVE:   COGNITIVE COMMUNICATION: Overall cognitive status: Impaired Areas of impairment:  Attention: Impaired: Sustained, Divided Memory: Impaired: Immediate Working Development worker, community comprehension: WFL Verbal expression: impaired divergent naming; occasional anomia noted during conversation Functional communication: Impaired: as above Functional deficits: pt reports difficulty  with conversation in group settings as well as 1:1  AUDITORY COMPREHENSION: Overall auditory comprehension: Appears intact YES/NO questions: Appears intact Following directions: Appears intact Conversation: Simple    READING COMPREHENSION: TBA  EXPRESSION: verbal  VERBAL EXPRESSION: Level of generative/spontaneous verbalization: sentence and conversation Automatic speech: name: intact  Repetition: Appears intact Naming: Divergent: 9 "p" words in 60s, 22 "animals" in in 60s Pragmatics: Appears intact Comments: few instances of anomia during informal conversational exchanges with inconsistent ability to repair Interfering components:  attention/working memory Effective technique:  visualization, circumlocution  WRITTEN EXPRESSION: Dominant hand: right   Written expression:  functional for clockdrawing; TBA  MOTOR SPEECH: Overall motor speech: Appears intact  ORAL MOTOR EXAMINATION: Facial : WFL Lingual: WFL Velum: WFL Mandible: WFL Cough: WFL Voice: WFL   STANDARDIZED ASSESSMENTS: Addenbrooke's Cognitive Examination - ACE III The Addenbrooke's Cognitive Examination-III (ACE-III) is a brief cognitive test that assesses five cognitive domains. The total score is 100 with higher scores indicating better cognitive functioning. Cut off scores of 88 and 82 are recommended for suspicion of dementia (88 has sensitivity of 1.00 and specificity of 0.96, 82 has sensitivity of 0.93 and specificity of 1.00). American Version A  Attention 17/18  Memory 24/26  Fluency 11/14  Language 26/26  Visuospatial 16/16  TOTAL ACE- III Score 94/100     PATIENT REPORTED OUTCOME MEASURES (PROM):  MULTIFACTORIAL MEMORY QUESTIONNAIRE (MMQ)  Administered patient self-reported outcome measure Multifactorial Memory Questionnaire (MMQ). The Multifactorial Memory Questionnaire [facility]) consists of three scales measuring separate aspects of metamemory; Satisfaction, Ability and Strategy.   Pt's  responses are converted to  T-Scores with severity levels based on pt's T-Score.   Severity Levels (T-score) Very Low - < 20 Low - 20 to 29 Below Average - 30-39 Average - 40 to 60 Above Average - 60 to 70 High - 71 to 80 Very High - > 80  Pt reports:  Very Low - < 20 Memory Satisfaction (T-score: 31) Below Average - 30-39 Memory Ability (T-score: 34) Average - 40 to 60 use of Memory Strategies (T-score: 55)        PATIENT EDUCATION: Education details: role of SLP, assessment results, and SLP POC  Person educated: Patient Education method: Explanation Education comprehension: verbalized understanding  HOME EXERCISE PROGRAM:        To be given in upcoming sessions     GOALS:  Goals reviewed with patient? Yes  SHORT TERM GOALS: Target date: 10 sessions  Patient will establish external aid for memory/executive function and bring to more than 75% of therapy sessions.  Baseline:  Goal status: INITIAL   2.  Pt will use strategies to improve memory for important information with 90% acc. Independently (ie., white board, daily planner/calendar, Apps on phone).  Baseline:  Goal status: INITIAL  3.  Patient will recall at least x2 strategies to maintain attention to linguistic activities of interest.  Baseline:  Goal status: INITIAL  4.  Pt will identify x2 compensatory strategies for anomia to be utilize during structured/unstructured tasks.  Baseline: Goal status: INITIAL  5.  Pt will participate in further assessment of functional reading/writing.  Baseline:  Goal status: INITIAL    LONG TERM GOALS: Target date:   Pt will report improved cognitive communication via PROM by 5 points at last ST session. Baseline: MMQ Very Low - < 20 Memory Satisfaction (T-score: 31) Below Average - 30-39 Memory Ability (T-score: 34) Average - 40 to 60 use of Memory Strategies (T-score: 55) Goal status: INITIAL  2.  Pt will utilize compensatory strategies for anomia with  modified independence to repair communication breakdowns as the conversation level.  Baseline:  Goal status: INITIAL  3.  Patient will demonstrate understanding of appropriate functional tasks/strategies for daily cognitive activities.  Baseline:  Goal status: INITIAL     ASSESSMENT:  CLINICAL IMPRESSION:  Patient is a 77 y.o. female who was seen today for cognitive-communication evaluation in setting of mild cognitive impairment likely from neurodegenerative disease per Dr. Sherryll Burger on 12/25/22. Pt with reported difficulty with wordfinding, spelling, and memory (losing items, concerned about driving) as well as intermittent difficulty sleeping. Dr. Sherryll Burger questions if some component of depression affecting cognition. MRI and further work up from Dr. Sherryll Burger pending. Recent SLUMS 22/30 during referring visit with Dr. Sherryll Burger. Assessment completed via ACE-III and PROM. Pt presents with mild cognitive-communicatoin deficits affecting attention, memory (immediate, short term, working), and verbal fluency. Few instances of anomia noted during informal conversational exchanges with inconsistent ability to repair communication breakdowns. Pt would benefit from course of SLP tx targeting functional cognitive-communication and education in order to improve pt's participation in iADLs, preferred activities, and QoL.   OBJECTIVE IMPAIRMENTS include attention, memory, and expressive language. These impairments are limiting patient from managing appointments, household responsibilities, ADLs/IADLs, and effectively communicating at home and in community. Factors affecting potential to achieve goals and functional outcome are ability to learn/carryover information and pending medical work up .Marland Kitchen Patient will benefit from skilled SLP services to address above impairments and improve overall function.  REHAB POTENTIAL: Good  PLAN: SLP FREQUENCY: 1-2x/week  SLP DURATION: 12 weeks  PLANNED INTERVENTIONS: Cognitive  reorganization, Internal/external aids, Functional tasks, SLP instruction and feedback, Compensatory strategies, Patient/family education, and Re-evaluation    Clyde Canterbury, M.S., CCC-SLP Speech-Language Pathologist [place] [facility] [phone] Arnette Felts)  [place] [facility] at [facility] [address] [place], [place], [address] Phone: [phone]   Fax:  [phone]

## 2023-02-11 ENCOUNTER — Ambulatory Visit: Payer: Medicare HMO | Admitting: Speech Pathology

## 2023-02-11 DIAGNOSIS — G3184 Mild cognitive impairment, so stated: Secondary | ICD-10-CM

## 2023-02-11 DIAGNOSIS — R41841 Cognitive communication deficit: Secondary | ICD-10-CM

## 2023-02-11 NOTE — Therapy (Addendum)
OUTPATIENT SPEECH LANGUAGE PATHOLOGY  TREATMENT   Patient Name: Miranda Hampton MRN: [id number] DOB:1945/11/23, 77 y.o., female Today's Date: 02/11/2023  PCP: Burnett Sheng, MD  REFERRING PROVIDER: Sherryll Burger, MD   End of Session - 02/11/23 1521     Visit Number 2    Number of Visits 25    Date for SLP Re-Evaluation 04/30/23    SLP Start Time 1155    SLP Stop Time  1250    SLP Time Calculation (min) 55 min    Activity Tolerance Patient tolerated treatment well              Patient Active Problem List   Diagnosis Date Noted   OA (osteoarthritis) of hip 02/18/2018    ONSET DATE: 12/25/22    REFERRING DIAG: mild cognitive impairment  THERAPY DIAG:  Cognitive communication deficit  MCI (mild cognitive impairment)  Rationale for Evaluation and Treatment Rehabilitation  SUBJECTIVE:   SUBJECTIVE STATEMENT: Pt alert, pleasant, and cooperative. Aware of dx of MCI, current medical work up (including pending MRI), and concerned about wordfinding and memory.  Pt accompanied by: self  PERTINENT HISTORY: 77 y.o. 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 Sherryll Burger on 12/25/22. Hx of breast cancer.   DIAGNOSTIC FINDINGS: MRI pending  PAIN:  Are you having pain? No   FALLS: Has patient fallen in last 6 months?  No  LIVING ENVIRONMENT: Lives with: lives with their spouse Lives in: House/apartment  PLOF:  Level of assistance: Independent with ADLs, Independent with IADLs Employment: Retired   PATIENT GOALS   to improve memory and commincation  OBJECTIVE:   TODAY'S TREATMENT: Pt seen for skilled SLP services targeting functional writing, memory, and attention. Pt completed basic intake form with x1 instance of anomia (great-granddaughter's name) and x1 instance of spelling difficulty. Pt concerned re: anomia and dysgraphia as she feels like instances are increasing in frequency. Introduced compensatory strategies for improved attention  (e.g. environmental modifications, rest breaks, time of day, understanding effects of medications, etc). Pt provided with hand out for reinforcement of content. Pt identified x3 strategies she would attempt to implement into daily practice.     PATIENT EDUCATION: Education details: attention strategies, POC Person educated: Patient Education method: Explanation and Handouts Education comprehension: verbalized understanding  HOME EXERCISE PROGRAM:        To be given in upcoming sessions     GOALS:  Goals reviewed with patient? Yes  SHORT TERM GOALS: Target date: 10 sessions  Patient will establish external aid for memory/executive function and bring to more than 75% of therapy sessions.  Baseline:  Goal status: INITIAL   2.  Pt will use strategies to improve memory for important information with 90% acc. Independently (ie., white board, daily planner/calendar, Apps on phone).  Baseline:  Goal status: INITIAL  3.  Patient will recall at least x2 strategies to maintain attention to linguistic activities of interest.  Baseline:  Goal status: INITIAL  4.  Pt will identify x2 compensatory strategies for anomia to be utilize during structured/unstructured tasks.  Baseline: Goal status: INITIAL  5.  Pt will participate in further assessment of functional reading/writing.  Baseline:  Goal status: INITIAL    LONG TERM GOALS: Target date: 04/30/23  Pt will report improved cognitive communication via PROM by 5 points at last ST session. Baseline: MMQ Very Low - < 20 Memory Satisfaction (T-score: 31) Below Average - 30-39 Memory Ability (T-score: 34) Average - 40 to 60 use of Memory  Strategies (T-score: 55) Goal status: INITIAL  2.  Pt will utilize compensatory strategies for anomia with modified independence to repair communication breakdowns as the conversation level.  Baseline:  Goal status: INITIAL  3.  Patient will demonstrate understanding of appropriate functional  tasks/strategies for daily cognitive activities.  Baseline:  Goal status: INITIAL     ASSESSMENT:  CLINICAL IMPRESSION:  Patient is a 77 y.o. female who was seen today for cognitive-communication evaluation in setting of mild cognitive impairment likely from neurodegenerative disease per Dr. Sherryll Burger on 12/25/22. Pt with ongoing mild cognitive-communicatoin deficits affecting attention, memory (immediate, short term, working), and verbal fluency. See above for details of today's tx session. Pt would benefit from ongoing SLP services targeting functional cognitive-communication and education in order to improve pt's participation in iADLs, preferred activities, and QoL.   OBJECTIVE IMPAIRMENTS include attention, memory, and expressive language. These impairments are limiting patient from managing appointments, household responsibilities, ADLs/IADLs, and effectively communicating at home and in community. Factors affecting potential to achieve goals and functional outcome are ability to learn/carryover information and pending medical work up .Marland Kitchen Patient will benefit from skilled SLP services to address above impairments and improve overall function.  REHAB POTENTIAL: Good  PLAN: SLP FREQUENCY: 1-2x/week  SLP DURATION: 12 weeks  PLANNED INTERVENTIONS: Cognitive reorganization, Internal/external aids, Functional tasks, SLP instruction and feedback, Compensatory strategies, Patient/family education, and Re-evaluation    Clyde Canterbury, M.S., CCC-SLP Speech-Language Pathologist [place] [facility] [phone] Arnette Felts)  [place] [facility] Outpatient Rehabilitation at [facility] [address] [place], [place], [address] Phone: [phone]   Fax:  [phone]

## 2023-02-13 ENCOUNTER — Ambulatory Visit: Payer: Medicare HMO | Admitting: Speech Pathology

## 2023-02-17 ENCOUNTER — Ambulatory Visit: Payer: Medicare HMO | Admitting: Speech Pathology

## 2023-02-17 DIAGNOSIS — R41841 Cognitive communication deficit: Secondary | ICD-10-CM

## 2023-02-17 DIAGNOSIS — G3184 Mild cognitive impairment, so stated: Secondary | ICD-10-CM

## 2023-02-17 NOTE — Therapy (Signed)
OUTPATIENT SPEECH LANGUAGE PATHOLOGY  TREATMENT   Patient Name: Miranda Hampton MRN: [id number] DOB:Dec 21, 1945, 77 y.o., female Today's Date: 02/17/2023  PCP: Burnett Sheng, MD  REFERRING PROVIDER: Sherryll Burger, MD   End of Session - 02/17/23 1244     Visit Number 3    Number of Visits 25    Date for SLP Re-Evaluation 04/30/23    SLP Start Time 1100    SLP Stop Time  1200    SLP Time Calculation (min) 60 min    Activity Tolerance Patient tolerated treatment well              Patient Active Problem List   Diagnosis Date Noted   OA (osteoarthritis) of hip 02/18/2018    ONSET DATE: 12/25/22    REFERRING DIAG: mild cognitive impairment  THERAPY DIAG:  Cognitive communication deficit  MCI (mild cognitive impairment)  Rationale for Evaluation and Treatment Rehabilitation  SUBJECTIVE:   SUBJECTIVE STATEMENT: Pt alert, pleasant, and cooperative. Aware of dx of MCI, current medical work up (including pending MRI), and concerned about wordfinding and memory.  Pt accompanied by: self  PERTINENT HISTORY: 77 y.o. 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 Sherryll Burger on 12/25/22. Hx of breast cancer.   DIAGNOSTIC FINDINGS: MRI pending  PAIN:  Are you having pain? No   FALLS: Has patient fallen in last 6 months?  No  LIVING ENVIRONMENT: Lives with: lives with their spouse Lives in: House/apartment  PLOF:  Level of assistance: Independent with ADLs, Independent with IADLs Employment: Retired   PATIENT GOALS   to improve memory and commincation  OBJECTIVE:   TODAY'S TREATMENT: Pt seen for skilled SLP services targeting functional memory and attention. Reviewed compensatory strategies for improved attention (e.g. environmental modifications, rest breaks, time of day, understanding effects of medications, etc). Pt stated she ordered blue light blocking glasses to hopefully improve sleep. Reviewed effect of sleep on cognitive-linguistic  functioning. Reviewed results of PROM, Multifactorial Memory Questionnaire, which pt completed on initial evaluation. Particular emphasis placed on Memory Mistakes and Use of Memory Strategies. Introduced Stage manager with pt including writing things down (e.g. lists, calendar, names), setting reminders (e.g. phone/Alexa), repeating information, and having a consistent routine/place for objects. Pt endorsed use of pill box to manage medicine, but inconsistently taking medication. Pt tasked with the following to be completed prior to next session: 1) Identify place to put purse (with keys inside) and glasses 2) Purchase a planner to keep necessary appointments, lists, names, etc, in one place 3) Set Alexa reminder to take pills daily. Pt agreeable to the above.      PATIENT EDUCATION: Education details: attention strategies, memory strategies, role of sleep in cognitive-linguistic functioning, POC Person educated: Patient Education method: Explanation and Handouts Education comprehension: verbalized understanding  HOME EXERCISE PROGRAM:        To be given in upcoming sessions     GOALS:  Goals reviewed with patient? Yes  SHORT TERM GOALS: Target date: 10 sessions  Patient will establish external aid for memory/executive function and bring to more than 75% of therapy sessions.  Baseline:  Goal status: INITIAL   2.  Pt will use strategies to improve memory for important information with 90% acc. Independently (ie., white board, daily planner/calendar, Apps on phone).  Baseline:  Goal status: INITIAL  3.  Patient will recall at least x2 strategies to maintain attention to linguistic activities of interest.  Baseline:  Goal status: INITIAL  4.  Pt will identify  x2 compensatory strategies for anomia to be utilize during structured/unstructured tasks.  Baseline: Goal status: INITIAL  5.  Pt will participate in further assessment of functional reading/writing.  Baseline:  Goal  status: INITIAL    LONG TERM GOALS: Target date: 04/30/23  Pt will report improved cognitive communication via PROM by 5 points at last ST session. Baseline: MMQ Very Low - < 20 Memory Satisfaction (T-score: 31) Below Average - 30-39 Memory Ability (T-score: 34) Average - 40 to 60 use of Memory Strategies (T-score: 55) Goal status: INITIAL  2.  Pt will utilize compensatory strategies for anomia with modified independence to repair communication breakdowns as the conversation level.  Baseline:  Goal status: INITIAL  3.  Patient will demonstrate understanding of appropriate functional tasks/strategies for daily cognitive activities.  Baseline:  Goal status: INITIAL     ASSESSMENT:  CLINICAL IMPRESSION:  Patient is a 77 y.o. 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 Sherryll Burger on 12/25/22. Pt with ongoing mild cognitive-communication deficits affecting attention, memory (immediate, short term, working), and verbal fluency. See above for details of today's tx session. Pt would benefit from ongoing SLP services targeting functional cognitive-communication and education in order to improve pt's participation in iADLs, preferred activities, and QoL.   OBJECTIVE IMPAIRMENTS include attention, memory, and expressive language. These impairments are limiting patient from managing appointments, household responsibilities, ADLs/IADLs, and effectively communicating at home and in community. Factors affecting potential to achieve goals and functional outcome are ability to learn/carryover information and pending medical work up .Marland Kitchen Patient will benefit from skilled SLP services to address above impairments and improve overall function.  REHAB POTENTIAL: Good  PLAN: SLP FREQUENCY: 1-2x/week  SLP DURATION: 12 weeks  PLANNED INTERVENTIONS: Cognitive reorganization, Internal/external aids, Functional tasks, SLP instruction and feedback, Compensatory strategies,  Patient/family education, and Re-evaluation    Clyde Canterbury, M.S., CCC-SLP Speech-Language Pathologist [place] [facility] [phone] Arnette Felts)  [place] [facility] Outpatient Rehabilitation at [facility] [address] [place], [place], [address] Phone: [phone]   Fax:  [phone]

## 2023-02-18 ENCOUNTER — Ambulatory Visit: Payer: Medicare HMO | Admitting: Speech Pathology

## 2023-02-24 ENCOUNTER — Ambulatory Visit: Payer: Medicare HMO | Admitting: Speech Pathology

## 2023-02-24 DIAGNOSIS — R41841 Cognitive communication deficit: Secondary | ICD-10-CM

## 2023-02-24 DIAGNOSIS — G3184 Mild cognitive impairment, so stated: Secondary | ICD-10-CM

## 2023-02-24 NOTE — Therapy (Signed)
OUTPATIENT SPEECH LANGUAGE PATHOLOGY  TREATMENT   Patient Name: Miranda Hampton MRN: [id number] DOB:11/23/1945, 77 y.o., female Today's Date: 02/24/2023  PCP: Burnett Sheng, MD  REFERRING PROVIDER: Sherryll Burger, MD   End of Session - 02/24/23 0941     Visit Number 4    Number of Visits 25    Date for SLP Re-Evaluation 04/30/23    SLP Start Time 0845    SLP Stop Time  0935    SLP Time Calculation (min) 50 min    Activity Tolerance Patient tolerated treatment well              Patient Active Problem List   Diagnosis Date Noted   OA (osteoarthritis) of hip 02/18/2018    ONSET DATE: 12/25/22    REFERRING DIAG: mild cognitive impairment  THERAPY DIAG:  Cognitive communication deficit  MCI (mild cognitive impairment)  Rationale for Evaluation and Treatment Rehabilitation  SUBJECTIVE:   SUBJECTIVE STATEMENT: Pt alert, pleasant, and cooperative. Aware of dx of MCI, current medical work up (including pending MRI), and concerned about wordfinding and memory.  Pt accompanied by: self  PERTINENT HISTORY: 77 y.o. 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 Sherryll Burger on 12/25/22. Hx of breast cancer.   DIAGNOSTIC FINDINGS: MRI pending  PAIN:  Are you having pain? No   FALLS: Has patient fallen in last 6 months?  No  LIVING ENVIRONMENT: Lives with: lives with their spouse Lives in: House/apartment  PLOF:  Level of assistance: Independent with ADLs, Independent with IADLs Employment: Retired   PATIENT GOALS   to improve memory and commincation  OBJECTIVE:   TODAY'S TREATMENT: Pt seen for skilled SLP services targeting functional memory and attention. Pt reported completion of the following tasks: 1) Identify place to put purse (with keys inside) and glasses 2) Utilize a notebook for pertinent notes and use of iPhone for appointments (in lieu of planner) 3) Set Alexa reminder to take pills daily. Pt reports improve success in finding  purse/keys and remembering appointments / medications with use. Introduced specifics strategies to recall names (e.g. repeating name, association, visualization, physical attribute, jotting down name). Pt utilized with extra time to code and recall x5 pictured persons-names immediately and after 25 minute delay with distraction. Pt able to independently recall x3 memory/attention strategies pertinent to her iADLs.   PATIENT EDUCATION: Education details: attention strategies, memory strategies (particularly for name recall), POC Person educated: Patient Education method: Explanation and Handouts Education comprehension: verbalized understanding  HOME EXERCISE PROGRAM:        Informal - practice name remembering strategy     GOALS:  Goals reviewed with patient? Yes  SHORT TERM GOALS: Target date: 10 sessions  Patient will establish external aid for memory/executive function and bring to more than 75% of therapy sessions.  Baseline:  Goal status: IN PROGRESS   2.  Pt will use strategies to improve memory for important information with 90% acc. Independently (ie., white board, daily planner/calendar, Apps on phone).  Baseline:  Goal status: IN PROGRESS  3.  Patient will recall at least x2 strategies to maintain attention to linguistic activities of interest.  Baseline:  Goal status: IN PROGRESS  4.  Pt will identify x2 compensatory strategies for anomia to be utilize during structured/unstructured tasks.  Baseline: Goal status: INITIAL  5.  Pt will participate in further assessment of functional reading/writing.  Baseline:  Goal status: MET    LONG TERM GOALS: Target date: 04/30/23  Pt will report improved  cognitive communication via PROM by 5 points at last ST session. Baseline: MMQ Very Low - < 20 Memory Satisfaction (T-score: 31) Below Average - 30-39 Memory Ability (T-score: 34) Average - 40 to 60 use of Memory Strategies (T-score: 55) Goal status: INITIAL  2.  Pt  will utilize compensatory strategies for anomia with modified independence to repair communication breakdowns as the conversation level.  Baseline:  Goal status: INITIAL  3.  Patient will demonstrate understanding of appropriate functional tasks/strategies for daily cognitive activities.  Baseline:  Goal status: INITIAL     ASSESSMENT:  CLINICAL IMPRESSION:  Patient is a 77 y.o. 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 Sherryll Burger on 12/25/22. Pt with ongoing mild cognitive-communication deficits affecting attention, memory (immediate, short term, working), and verbal fluency. See above for details of today's tx session. Pt would benefit from ongoing SLP services targeting functional cognitive-communication and education in order to improve pt's participation in iADLs, preferred activities, and QoL.   OBJECTIVE IMPAIRMENTS include attention, memory, and expressive language. These impairments are limiting patient from managing appointments, household responsibilities, ADLs/IADLs, and effectively communicating at home and in community. Factors affecting potential to achieve goals and functional outcome are ability to learn/carryover information and pending medical work up .Marland Kitchen Patient will benefit from skilled SLP services to address above impairments and improve overall function.  REHAB POTENTIAL: Good  PLAN: SLP FREQUENCY: 1-2x/week  SLP DURATION: 12 weeks  PLANNED INTERVENTIONS: Cognitive reorganization, Internal/external aids, Functional tasks, SLP instruction and feedback, Compensatory strategies, Patient/family education, and Re-evaluation    Clyde Canterbury, M.S., CCC-SLP Speech-Language Pathologist [facility] [phone] Arnette Felts)  [facility] Outpatient Rehabilitation at [facility] [address] [place], [place], [address] Phone: [phone]   Fax:  [phone]

## 2023-02-27 ENCOUNTER — Encounter: Payer: Self-pay | Admitting: Speech Pathology

## 2023-02-27 ENCOUNTER — Ambulatory Visit: Payer: Medicare HMO | Admitting: Speech Pathology

## 2023-02-27 DIAGNOSIS — R41841 Cognitive communication deficit: Secondary | ICD-10-CM
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
## 2023-03-03 ENCOUNTER — Ambulatory Visit: Payer: Medicare HMO | Admitting: Speech Pathology

## 2023-03-05 ENCOUNTER — Ambulatory Visit: Payer: Medicare HMO | Admitting: Speech Pathology

## 2023-03-05 DIAGNOSIS — G3184 Mild cognitive impairment, so stated: Secondary | ICD-10-CM

## 2023-03-05 DIAGNOSIS — R41841 Cognitive communication deficit: Secondary | ICD-10-CM

## 2023-03-05 NOTE — Therapy (Signed)
OUTPATIENT SPEECH LANGUAGE PATHOLOGY  TREATMENT   Patient Name: Miranda Hampton MRN: [id number] DOB:Jul 27, 1946, 77 y.o., female Today's Date: 03/05/2023  PCP: Burnett Sheng, MD  REFERRING PROVIDER: Sherryll Burger, MD   End of Session - 03/05/23 1303     Visit Number 6    Number of Visits 25    Date for SLP Re-Evaluation 04/30/23    SLP Start Time 1100    SLP Stop Time  1145    SLP Time Calculation (min) 45 min    Activity Tolerance Patient tolerated treatment well              Patient Active Problem List   Diagnosis Date Noted   OA (osteoarthritis) of hip 02/18/2018    ONSET DATE: 12/25/22    REFERRING DIAG: mild cognitive impairment  THERAPY DIAG:  Cognitive communication deficit  MCI (mild cognitive impairment)  Rationale for Evaluation and Treatment Rehabilitation  SUBJECTIVE:   SUBJECTIVE STATEMENT: Pt alert, pleasant, and cooperative. Aware of dx of MCI, current medical work up (including pending MRI), and concerned about wordfinding and memory.  Pt accompanied by: self  PERTINENT HISTORY: 77 y.o. 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 Sherryll Burger on 12/25/22. Hx of breast cancer.   DIAGNOSTIC FINDINGS: MRI pending  PAIN:  Are you having pain? No   FALLS: Has patient fallen in last 6 months?  No  LIVING ENVIRONMENT: Lives with: lives with their spouse Lives in: House/apartment  PLOF:  Level of assistance: Independent with ADLs, Independent with IADLs Employment: Retired   PATIENT GOALS   to improve memory and commincation  OBJECTIVE:   TODAY'S TREATMENT: Pt seen for skilled SLP services targeting functional memory, attention, and anomia. Pt with self-reported improvements in ability to manage appointments, keys, glasses, and medication with use of strategies. Pt also reported successful use of name recall strategy during x2 appointments. Introduced anomia strategies. Pt able to utilize with min assistance during  barrier task.  PATIENT EDUCATION: Education details: attention strategies, memory strategies, anomia strategies, POC Person educated: Patient Education method: Explanation and Handouts Education comprehension: verbalized understanding  HOME EXERCISE PROGRAM:        Informal - practice anomia strategies     GOALS:  Goals reviewed with patient? Yes  SHORT TERM GOALS: Target date: 10 sessions  Patient will establish external aid for memory/executive function and bring to more than 75% of therapy sessions.  Baseline:  Goal status: IN PROGRESS   2.  Pt will use strategies to improve memory for important information with 90% acc. Independently (ie., white board, daily planner/calendar, Apps on phone).  Baseline:  Goal status: IN PROGRESS  3.  Patient will recall at least x2 strategies to maintain attention to linguistic activities of interest.  Baseline:  Goal status: IN PROGRESS  4.  Pt will identify x2 compensatory strategies for anomia to be utilize during structured/unstructured tasks.  Baseline: Goal status: INITIAL  5.  Pt will participate in further assessment of functional reading/writing.  Baseline:  Goal status: MET    LONG TERM GOALS: Target date: 04/30/23  Pt will report improved cognitive communication via PROM by 5 points at last ST session. Baseline: MMQ Very Low - < 20 Memory Satisfaction (T-score: 31) Below Average - 30-39 Memory Ability (T-score: 34) Average - 40 to 60 use of Memory Strategies (T-score: 55) Goal status: INITIAL  2.  Pt will utilize compensatory strategies for anomia with modified independence to repair communication breakdowns as the conversation level.  Baseline:  Goal status: INITIAL  3.  Patient will demonstrate understanding of appropriate functional tasks/strategies for daily cognitive activities.  Baseline:  Goal status: INITIAL     ASSESSMENT:  CLINICAL IMPRESSION:  Patient is a 77 y.o. female with mild cognitive  impairment likely from neurodegenerative disease per Dr. Sherryll Burger on 12/25/22. Pt with ongoing mild cognitive-communication deficits affecting attention, memory (immediate, short term, working), and verbal fluency. See above for details of today's tx session. Pt would benefit from ongoing SLP services targeting functional cognitive-communication and education in order to improve pt's participation in iADLs, preferred activities, and QoL.   OBJECTIVE IMPAIRMENTS include attention, memory, and expressive language. These impairments are limiting patient from managing appointments, household responsibilities, ADLs/IADLs, and effectively communicating at home and in community. Factors affecting potential to achieve goals and functional outcome are ability to learn/carryover information and pending medical work up .Marland Kitchen Patient will benefit from skilled SLP services to address above impairments and improve overall function.  REHAB POTENTIAL: Good  PLAN: SLP FREQUENCY: 1-2x/week  SLP DURATION: 12 weeks  PLANNED INTERVENTIONS: Cognitive reorganization, Internal/external aids, Functional tasks, SLP instruction and feedback, Compensatory strategies, Patient/family education, and Re-evaluation    Clyde Canterbury, M.S., CCC-SLP Speech-Language Pathologist [facility] [phone] Arnette Felts)   [facility] at [facility] [address] [place], [place], [address] Phone: [phone]   Fax:  [phone]

## 2023-03-10 ENCOUNTER — Ambulatory Visit: Payer: Medicare HMO | Attending: Neurology | Admitting: Speech Pathology

## 2023-03-10 DIAGNOSIS — R41841 Cognitive communication deficit: Secondary | ICD-10-CM | POA: Insufficient documentation

## 2023-03-10 DIAGNOSIS — G3184 Mild cognitive impairment, so stated: Secondary | ICD-10-CM | POA: Insufficient documentation

## 2023-03-10 NOTE — Therapy (Signed)
OUTPATIENT SPEECH LANGUAGE PATHOLOGY  TREATMENT / DISCHARGE SUMMARY  Dates of Reporting Period: 02/05/23 to 8/5  Objective: Patient has been seen for 7 speech therapy sessions this reporting period targeting cognitive-linguistic deficits Patient has met all LTGs and STGs this reporting period.  SLP d/c pt from services given the above. See skilled intervention, clinical impressions, and goals below for details.  Patient Name: Miranda Hampton MRN: [id number] DOB:1945-08-18, 77 y.o., female Today's Date: 03/10/2023  PCP: Burnett Sheng, MD  REFERRING PROVIDER: Sherryll Burger, MD   End of Session - 03/10/23 1150     Visit Number 7    Number of Visits 25    Date for SLP Re-Evaluation 04/30/23    SLP Start Time 1100    SLP Stop Time  1145    SLP Time Calculation (min) 45 min    Activity Tolerance Patient tolerated treatment well              Patient Active Problem List   Diagnosis Date Noted   OA (osteoarthritis) of hip 02/18/2018    ONSET DATE: 12/25/22    REFERRING DIAG: mild cognitive impairment  THERAPY DIAG:  Cognitive communication deficit  MCI (mild cognitive impairment)  Rationale for Evaluation and Treatment Rehabilitation  SUBJECTIVE:   SUBJECTIVE STATEMENT: Pt alert, pleasant, and cooperative. Aware of dx of MCI, current medical work up (including pending MRI), and concerned about wordfinding and memory.  Pt accompanied by: self  PERTINENT HISTORY: 77 y.o. 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 Sherryll Burger on 12/25/22. Hx of breast cancer.   DIAGNOSTIC FINDINGS: MRI brain, 03/04/23 "Mild age-appropriate global cerebral volume loss without lobar predilection."  PAIN:  Are you having pain? No   FALLS: Has patient fallen in last 6 months?  No  LIVING ENVIRONMENT: Lives with: lives with their spouse Lives in: House/apartment  PLOF:  Level of assistance: Independent with ADLs, Independent with IADLs Employment:  Retired   PATIENT GOALS   to improve memory and communication  OBJECTIVE:   TODAY'S TREATMENT: Pt seen for skilled SLP services targeting functional memory, attention, and anomia. Pt reported successful implementation of several attention, memory, and anomia strategies to manage daily affairs, recall new informations, and to successfully communicate in home and in public. No s/sx anomia noted during today's visit.   PROM repeated with scores as follows:  MULTIFACTORIAL MEMORY QUESTIONNAIRE (MMQ)  Administered patient self-reported outcome measure Multifactorial Memory Questionnaire (MMQ). The Multifactorial Memory Questionnaire [facility]) consists of three scales measuring separate aspects of metamemory; Satisfaction, Ability and Strategy.   Pt's responses are converted to T-Scores with severity levels based on pt's T-Score.   Severity Levels (T-score) Very Low - < 20 Low - 20 to 29 Below Average - 30-39 Average - 40 to 60 Above Average - 60 to 70 High - 71 to 80 Very High - > 80  Pt reports:  Average - 40 to 60 Memory Satisfaction (T-score: 52) Average - 40 to 60 Memory Ability (T-score: 58) Average - 40 to 60 use of Memory Strategies (T-score: 63)  Marked improvement on all scales since initiation of ST services.     PATIENT EDUCATION: Education details: attention strategies, memory strategies, anomia strategies, POC Person educated: Patient Education method: Explanation and Handouts Education comprehension: verbalized understanding  HOME EXERCISE PROGRAM:        N/A     GOALS:  Goals reviewed with patient? Yes  SHORT TERM GOALS: Target date: 10 sessions  Patient will establish external aid  for memory/executive function and bring to more than 75% of therapy sessions.  Baseline:  Goal status: MET   2.  Pt will use strategies to improve memory for important information with 90% acc. Independently (ie., white board, daily planner/calendar, Apps on phone).   Baseline:  Goal status: MET  3.  Patient will recall at least x2 strategies to maintain attention to linguistic activities of interest.  Baseline:  Goal status: MET  4.  Pt will identify x2 compensatory strategies for anomia to be utilize during structured/unstructured tasks.  Baseline: Goal status: MET  5.  Pt will participate in further assessment of functional reading/writing.  Baseline:  Goal status: MET    LONG TERM GOALS: Target date: 04/30/23  Pt will report improved cognitive communication via PROM by 5 points at last ST session. Baseline: MMQ Very Low - < 20 Memory Satisfaction (T-score: 31) Below Average - 30-39 Memory Ability (T-score: 34) Average - 40 to 60 use of Memory Strategies (T-score: 55) Goal status: MET  2.  Pt will utilize compensatory strategies for anomia with modified independence to repair communication breakdowns as the conversation level.  Baseline:  Goal status: MET  3.  Patient will demonstrate understanding of appropriate functional tasks/strategies for daily cognitive activities.  Baseline:  Goal status: MET     ASSESSMENT:  CLINICAL IMPRESSION:  Patient is a 77 y.o. 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 Sherryll Burger on 12/25/22. Pt has eagerly implemented skilled attention, memory, and anomia strategies to improve life home and community activities. Pt has met all goals. SLP to d/c pt from services.  OBJECTIVE IMPAIRMENTS include attention, memory, and expressive language. These impairments are limiting patient from managing appointments, household responsibilities, ADLs/IADLs, and effectively communicating at home and in community. Factors affecting potential to achieve goals and functional outcome are ability to learn/carryover information and pending medical work up .Marland Kitchen Patient will benefit from skilled SLP services to address above impairments and improve overall function.  REHAB POTENTIAL:  Good  PLAN: SLP FREQUENCY:  d/c from SLP services     Clyde Canterbury, M.S., CCC-SLP Speech-Language Pathologist [facility] [phone] Arnette Felts)  [facility] Outpatient Rehabilitation at [facility] [address] [place], [place], [address] Phone: [phone]   Fax:  [phone]

## 2023-03-13 ENCOUNTER — Ambulatory Visit: Payer: Medicare HMO | Admitting: Speech Pathology

## 2023-03-17 ENCOUNTER — Ambulatory Visit: Payer: Medicare HMO | Admitting: Speech Pathology

## 2023-03-20 ENCOUNTER — Encounter: Payer: Medicare HMO | Admitting: Speech Pathology

## 2023-03-24 ENCOUNTER — Encounter: Payer: Medicare HMO | Admitting: Speech Pathology

## 2023-03-27 ENCOUNTER — Encounter: Payer: Medicare HMO | Admitting: Speech Pathology

## 2023-04-03 ENCOUNTER — Ambulatory Visit: Payer: Medicare HMO

## 2023-04-03 ENCOUNTER — Encounter: Payer: Medicare HMO | Admitting: Speech Pathology

## 2023-04-03 DIAGNOSIS — K5730 Diverticulosis of large intestine without perforation or abscess without bleeding: Secondary | ICD-10-CM | POA: Diagnosis not present

## 2023-04-03 DIAGNOSIS — Z83719 Family history of colon polyps, unspecified: Secondary | ICD-10-CM | POA: Diagnosis not present

## 2023-04-03 DIAGNOSIS — Z1211 Encounter for screening for malignant neoplasm of colon: Secondary | ICD-10-CM | POA: Diagnosis present

## 2023-04-03 DIAGNOSIS — K640 First degree hemorrhoids: Secondary | ICD-10-CM | POA: Diagnosis not present

## 2023-06-17 ENCOUNTER — Other Ambulatory Visit: Payer: Self-pay | Admitting: Student

## 2023-06-17 DIAGNOSIS — M4302 Spondylolysis, cervical region: Secondary | ICD-10-CM

## 2023-06-30 ENCOUNTER — Ambulatory Visit
Admission: RE | Admit: 2023-06-30 | Discharge: 2023-06-30 | Disposition: A | Discharge status: Home / Self Care | Payer: Medicare HMO | Source: Ambulatory Visit | Attending: Student | Admitting: Student

## 2023-06-30 DIAGNOSIS — M4302 Spondylolysis, cervical region: Secondary | ICD-10-CM | POA: Insufficient documentation

## 2023-08-18 ENCOUNTER — Other Ambulatory Visit: Payer: Self-pay | Admitting: Family Medicine

## 2023-08-18 DIAGNOSIS — Z1231 Encounter for screening mammogram for malignant neoplasm of breast: Secondary | ICD-10-CM

## 2023-10-02 ENCOUNTER — Ambulatory Visit
Admission: RE | Admit: 2023-10-02 | Discharge: 2023-10-02 | Disposition: A | Discharge status: Home / Self Care | Payer: Medicare HMO | Source: Ambulatory Visit | Attending: Family Medicine | Admitting: Family Medicine

## 2023-10-02 DIAGNOSIS — Z1231 Encounter for screening mammogram for malignant neoplasm of breast: Secondary | ICD-10-CM | POA: Insufficient documentation

## 2023-11-17 NOTE — Progress Notes (Unsigned)
 Referring Physician:  Janice Coffin, PA-C [address] [place],  [place] [address]  Primary Physician:  Jerl Mina, MD  History of Present Illness: 11/17/2023*** Miranda Hampton has a history of ?Alzheimers, depression, breast CA, GERD, arthritis.   Neck pain? Arm Pain?  She is taking tylenol, neurontin, and mobic.    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   Bowel/Bladder Dysfunction: none  Conservative measures:  Physical therapy: *** has not participated in? Multimodal medical therapy including regular antiinflammatories: *** tylenol, gabapentin, dilaudid, meloxicam, robaxin Injections: *** no epidural steroid injections?  Past Surgery: ***no spinal surgeries   Miranda Hampton has ***no symptoms of cervical myelopathy.  The symptoms are causing a significant impact on the patient's life.   Review of Systems:  A 10 point review of systems is negative, except for the pertinent positives and negatives detailed in the HPI.  Past Medical History: Past Medical History:  Diagnosis Date   Anxiety    Arthritis    Breast cancer (HCC) 2005   Cancer (HCC) 2005   Right breast- chemo/radiation   Depression    GERD (gastroesophageal reflux disease)    Heart murmur    hx of    History of hiatal hernia    small   Motion sickness    cars - winding [place]   Neck stiffness    limited side to side turn   Personal history of chemotherapy    Personal history of radiation therapy    Wears contact lenses     Past Surgical History: Past Surgical History:  Procedure Laterality Date   ABDOMINAL HYSTERECTOMY  1991   BREAST BIOPSY Right 2005   lumpectomy   BREAST BIOPSY Left 2015   core- neg- BENIGN BREAST TISSUE WITH FIBROCYSTIC CHANGES    BREAST EXCISIONAL BIOPSY Right 2005    BUNIONECTOMY Bilateral    Left (06), Right (92)   CARPAL TUNNEL RELEASE Left 09/06/2008   CATARACT EXTRACTION W/PHACO Right 02/03/2018   Procedure: CATARACT EXTRACTION PHACO AND INTRAOCULAR LENS PLACEMENT (IOC) RIGHT;  Surgeon: Lockie Mola, MD;  Location: [facility];  Service: Ophthalmology;  Laterality: Right;   CATARACT EXTRACTION W/PHACO Left 03/25/2018   Procedure: CATARACT EXTRACTION PHACO AND INTRAOCULAR LENS PLACEMENT (IOC)  LEFT;  Surgeon: Lockie Mola, MD;  Location: [facility];  Service: Ophthalmology;  Laterality: Left;   COLONOSCOPY     FOOT SURGERY  10/07/2008   HAMMER TOE SURGERY Right 01/04/2011   HEMORROIDECTOMY  1982   JOINT REPLACEMENT     Left total hip Dr. Lequita Halt 02-18-2018   KNEE ARTHROSCOPY Left 08/01/1999   THUMB ARTHROSCOPY Right 04/13/2009   bil   TONSILLECTOMY  1954   TOTAL HIP ARTHROPLASTY Left 02/18/2018   Procedure: LEFT TOTAL HIP ARTHROPLASTY ANTERIOR APPROACH;  Surgeon: Ollen Gross, MD;  Location: [facility];  Service: Orthopedics;  Laterality: Left;   TUBAL LIGATION  07/02/1978    Allergies: Allergies as of 11/18/2023 - Review Complete 02/27/2023  Allergen Reaction Noted   Meperidine and related Other (See Comments) 01/27/2018   Oxycodone Hives 01/27/2018   Lodine [etodolac] Rash 01/27/2018    Medications: Outpatient Encounter Medications as of 11/18/2023  Medication Sig   acetaminophen (TYLENOL) 500 MG tablet Take 1,000 mg by mouth every 6 (six) hours as needed for moderate pain or headache.  diphenhydramine-acetaminophen (TYLENOL PM) 25-500 MG TABS tablet Take 2 tablets by mouth at bedtime.    gabapentin (NEURONTIN) 300 MG capsule Take 900 mg by mouth at bedtime.    HYDROmorphone (DILAUDID) 2 MG tablet Take 1 tablet (2 mg total) by mouth every 4 (four) hours as needed for moderate pain. (Patient not taking: Reported on 03/20/2018)   meloxicam (MOBIC) 15 MG tablet Take 15 mg by mouth daily.   methocarbamol  (ROBAXIN) 500 MG tablet Take 1 tablet (500 mg total) by mouth every 6 (six) hours as needed for muscle spasms. (Patient not taking: Reported on 03/20/2018)   omeprazole (PRILOSEC) 20 MG capsule Take 20 mg by mouth daily.   rivaroxaban (XARELTO) 10 MG TABS tablet Take 1 tablet (10 mg total) by mouth daily with breakfast. (Patient not taking: Reported on 03/25/2018)   sertraline (ZOLOFT) 50 MG tablet Take 50 mg by mouth daily.   No facility-administered encounter medications on file as of 11/18/2023.    Social History: Social History   Tobacco Use   Smoking status: Never   Smokeless tobacco: Never  Vaping Use   Vaping status: Never Used  Substance Use Topics   Alcohol use: Yes    Alcohol/week: 1.0 standard drink of alcohol    Types: 1 Glasses of wine per week    Comment: occasional   Drug use: Never    Family Medical History: Family History  Problem Relation Age of Onset   Breast cancer Maternal Grandmother 60    Physical Examination: There were no vitals filed for this visit.  General: Patient is well developed, well nourished, calm, collected, and in no apparent distress. Attention to examination is appropriate.  Respiratory: Patient is breathing without any difficulty.   NEUROLOGICAL:     Awake, alert, oriented to person, place, and time.  Speech is clear and fluent. Fund of knowledge is appropriate.   Cranial Nerves: Pupils equal round and reactive to light.  Facial tone is symmetric.    *** ROM of cervical spine *** pain *** posterior cervical tenderness. *** tenderness in bilateral trapezial region.   *** ROM of lumbar spine *** pain *** posterior lumbar tenderness.   No abnormal lesions on exposed skin.   Strength: Side Biceps Triceps Deltoid Interossei Grip Wrist Ext. Wrist Flex.  R 5 5 5 5 5 5 5   L 5 5 5 5 5 5 5    Side Iliopsoas Quads Hamstring PF DF EHL  R 5 5 5 5 5 5   L 5 5 5 5 5 5    Reflexes are ***2+ and symmetric at the biceps, brachioradialis,  patella and achilles.   Hoffman's is absent.  Clonus is not present.   Bilateral upper and lower extremity sensation is intact to light touch.     Gait is normal.   ***No difficulty with tandem gait.    Medical Decision Making  Imaging: MRI of cervical spine dated 06/30/23:  FINDINGS: Alignment: Stable alignment with a mild stepwise anterolisthesis at C3-4, C4-5 and C7-T1. No focal angulation.   Vertebrae: No acute or suspicious osseous findings. There are multilevel endplate degenerative changes. Chronic erosion of the odontoid process with asymmetric arthropathy at the left lateral masses of C1-2.   Cord: Normal in signal and caliber.   Posterior Fossa, vertebral arteries, paraspinal tissues: Visualized portions of the posterior fossa appear unremarkable.Bilateral vertebral artery flow voids. No significant paraspinal findings.   Disc levels:   C2-3: No significant disc pathology. Moderate bilateral facet hypertrophy. The spinal canal  is widely patent. There is stable mild right-greater-than-left foraminal narrowing.   C3-4: Spondylosis with mild loss of disc height and posterior osteophytes covering diffusely bulging disc material. Asymmetric uncinate spurring and facet hypertrophy on the right. Stable mild spinal stenosis without cord deformity. Stable chronic severe right and mild left foraminal narrowing.   C4-5: Chronic spondylosis with loss of disc height and posterior osteophytes covering diffusely bulging disc material. Evidence of chronic interfacetal ankylosis bilaterally. No significant central spinal stenosis. Stable mild right and moderate left foraminal narrowing.   C5-6: Spondylosis with chronic loss of disc height and posterior osteophytes covering diffusely bulging disc material. Mild bilateral facet hypertrophy. Stable mild spinal stenosis and moderate foraminal narrowing bilaterally.   C6-7: Spondylosis with chronic loss of disc height and  posterior osteophytes covering diffusely bulging disc material. Mild bilateral facet hypertrophy. Stable mild spinal stenosis and moderate foraminal narrowing bilaterally.   C7-T1: Stable mild loss of disc height with mild disc bulging and moderate bilateral facet hypertrophy. Stable mild spinal stenosis and mild to moderate foraminal narrowing bilaterally.   IMPRESSION: 1. No acute findings or significant change from previous MRI of 08/12/2018. 2. Stable multilevel cervical spondylosis with mild multilevel spinal stenosis and varying degrees of foraminal narrowing as described. 3. Chronic erosion of the odontoid process with asymmetric arthropathy at the left lateral masses of C1-2. 4. No cord deformity or abnormal cord signal.     Electronically Signed   By: Elmon Hagedorn M.D.   On: 07/16/2023 10:24  I have personally reviewed the images and agree with the above interpretation.  Assessment and Plan: Miranda Hampton is a pleasant 78 y.o. female has ***  Treatment options discussed with patient and following plan made:   - Order for physical therapy for *** spine ***. Patient to call to schedule appointment. *** - Continue current medications including ***. Reviewed dosing and side effects.  - Prescription for ***. Reviewed dosing and side effects. Take with food.  - Prescription for *** to take prn muscle spasms. Reviewed dosing and side effects. Discussed this can cause drowsiness.  - MRI of *** to further evaluate *** radiculopathy. No improvement time or medications (***).  - Referral to PMR at [facility] to discuss possible *** injections.  - Will schedule phone visit to review MRI results once I get them back.   I spent a total of *** minutes in face-to-face and non-face-to-face activities related to this patient's care today including review of outside records, review of imaging, review of symptoms, physical exam, discussion of differential diagnosis, discussion of treatment  options, and documentation.   Thank you for involving me in the care of this patient.   Lucetta Russel PA-C Dept. of Neurosurgery

## 2023-11-18 ENCOUNTER — Encounter: Payer: Self-pay | Admitting: Orthopedic Surgery

## 2023-11-18 ENCOUNTER — Ambulatory Visit: Admitting: Orthopedic Surgery

## 2023-11-18 VITALS — BP 120/76 | Ht 61.0 in | Wt 138.0 lb

## 2023-11-18 DIAGNOSIS — M532X1 Spinal instabilities, occipito-atlanto-axial region: Secondary | ICD-10-CM | POA: Diagnosis not present

## 2023-11-18 DIAGNOSIS — M5021 Other cervical disc displacement,  high cervical region: Secondary | ICD-10-CM | POA: Diagnosis not present

## 2023-11-18 DIAGNOSIS — M47812 Spondylosis without myelopathy or radiculopathy, cervical region: Secondary | ICD-10-CM

## 2023-11-18 DIAGNOSIS — M4312 Spondylolisthesis, cervical region: Secondary | ICD-10-CM

## 2023-11-18 DIAGNOSIS — M50221 Other cervical disc displacement at C4-C5 level: Secondary | ICD-10-CM

## 2023-11-18 DIAGNOSIS — M5023 Other cervical disc displacement, cervicothoracic region: Secondary | ICD-10-CM

## 2023-11-18 DIAGNOSIS — M4802 Spinal stenosis, cervical region: Secondary | ICD-10-CM | POA: Diagnosis not present

## 2023-11-18 NOTE — Patient Instructions (Signed)
 It was so nice to see you today. Thank you so much for coming in.    You have a lot of wear and tear (arthritis) in your neck. The pain on the left side of your neck/head is likely from instability at C1-C2.   I put in orders for you to get an injection on the left side at C1-C2 with [facility]. They should call you to schedule this. Let me know if you don't hear from them by next week.  I want to get an CT of your neck to look into things further. We will get this approved through your insurance and [facility] will call you to schedule the appointment.   Ask about your patient responsibility. You do not need to pay this prior to getting the CT, they can bill you.   [facility] (building with the white pillars) is located off of [address]. The address is [address], [place], [place] [address].    After you have the CT, it takes 14-21 days for me to get the results back.   Once you have the injection and get the CT scan done, I want you to follow up with Dr. Mont Antis. Will schedule this once the results are back and injection is scheduled.   Please do not hesitate to call if you have any questions or concerns. You can also message me in MyChart.   Lucetta Russel PA-C [phone]     The physicians and staff at [facility] at [facility] are committed to providing excellent care. You may receive a survey asking for feedback about your experience at our office. We value you your feedback and appreciate you taking the time to to fill it out. The [facility] leadership team is also available to discuss your experience in person, feel free to contact us  [phone].

## 2023-11-25 ENCOUNTER — Ambulatory Visit
Admission: RE | Admit: 2023-11-25 | Discharge: 2023-11-25 | Disposition: A | Discharge status: Home / Self Care | Source: Ambulatory Visit | Attending: Orthopedic Surgery | Admitting: Orthopedic Surgery

## 2023-11-25 DIAGNOSIS — M47812 Spondylosis without myelopathy or radiculopathy, cervical region: Secondary | ICD-10-CM | POA: Diagnosis present

## 2023-11-25 DIAGNOSIS — M4312 Spondylolisthesis, cervical region: Secondary | ICD-10-CM | POA: Diagnosis present

## 2023-11-25 DIAGNOSIS — M532X1 Spinal instabilities, occipito-atlanto-axial region: Secondary | ICD-10-CM | POA: Insufficient documentation

## 2023-12-10 ENCOUNTER — Encounter: Payer: Self-pay | Admitting: Orthopedic Surgery

## 2023-12-10 NOTE — Progress Notes (Signed)
 CT of cervical spine dated 11/25/23:  FINDINGS: Alignment: The alignment is similar with a mild convex left scoliosis and a stepwise anterolisthesis at C3-4 and C4-5. There is also 3 mm of anterolisthesis at C7-T1.   Skull base and vertebrae: No evidence of acute fracture or traumatic subluxation. There are chronic erosive changes of the odontoid process without widening of the predental space. Chronic asymmetric arthropathy at the left lateral atlantoaxial articulation with joint space narrowing and osteophytes. Multilevel spondylosis with disc space narrowing, uncinate spurring and facet arthropathy. The facet joints are ankylosed bilaterally at C4-5.   Soft tissues and spinal canal: No prevertebral fluid or swelling. No visible canal hematoma.   Disc levels: Multilevel spondylosis as demonstrated on previous MRI. There is multilevel disc space narrowing, most advanced at C5-6 and C6-7. There is multilevel facet arthropathy, most advanced on the right at C3-4. There is multilevel foraminal narrowing including moderate to severe right foraminal narrowing at C3-4, moderate foraminal narrowing bilaterally at C5-6 and C6-7, and moderate to severe right foraminal narrowing at C7-T1.   Upper chest: Scarring at both lung apices.   Other: Bilateral carotid atherosclerosis. Advanced TMJ osteoarthritis bilaterally.   IMPRESSION: 1. No evidence of acute cervical spine fracture, traumatic subluxation or static signs of instability. 2. Chronic erosive changes of the odontoid process without widening of the predental space. Chronic asymmetric arthropathy at the left lateral atlantoaxial articulation. 3. Multilevel cervical spondylosis as described with multilevel foraminal narrowing, similar to previous MRI.     Electronically Signed   By: Elmon Hagedorn M.D.   On: 12/09/2023 10:44  I have personally reviewed the images and agree with the above interpretation.  Doesn't look like she  is scheduled for cervical injection. Will contact patient to see why and work on getting it scheduled. Once this is scheduled, then she will need f/u with Dr. Mont Antis.

## 2023-12-11 ENCOUNTER — Encounter: Payer: Self-pay | Admitting: Orthopedic Surgery

## 2023-12-12 ENCOUNTER — Telehealth: Payer: Self-pay | Admitting: Orthopedic Surgery

## 2023-12-12 ENCOUNTER — Other Ambulatory Visit: Payer: Self-pay | Admitting: Family Medicine

## 2023-12-12 DIAGNOSIS — M532X1 Spinal instabilities, occipito-atlanto-axial region: Secondary | ICD-10-CM

## 2023-12-12 DIAGNOSIS — M47812 Spondylosis without myelopathy or radiculopathy, cervical region: Secondary | ICD-10-CM

## 2023-12-12 DIAGNOSIS — M4312 Spondylolisthesis, cervical region: Secondary | ICD-10-CM

## 2023-12-12 NOTE — Discharge Instructions (Addendum)
 Post Procedure Spinal Discharge Instruction Sheet  You may resume a regular diet and any medications that you routinely take (including pain medications) unless otherwise noted by MD.  No driving day of procedure.  Light activity throughout the rest of the day.  Do not do any strenuous work, exercise, bending or lifting.  The day following the procedure, you can resume normal physical activity but you should refrain from exercising or physical therapy for at least three days thereafter.  You may apply ice to the injection site, 20 minutes on, 20 minutes off, as needed. Do not apply ice directly to skin.    Common Side Effects:  Headaches- take your usual medications as directed by your physician.  Increase your fluid intake.  Caffeinated beverages may be helpful.  Lie flat in bed until your headache resolves.  Restlessness or inability to sleep- you may have trouble sleeping for the next few days.  Ask your referring physician if you need any medication for sleep.  Facial flushing or redness- should subside within a few days.  Increased pain- a temporary increase in pain a day or two following your procedure is not unusual.  Take your pain medication as prescribed by your referring physician.  Leg cramps  Please contact our office at [phone] for the following symptoms: Fever greater than 100 degrees. Headaches unresolved with medication after 2-3 days. Increased swelling, pain, or redness at injection site.  MAY RESUME TAKING YOUR ASPIRIN  TODAY.  Thank you for visiting [facility] today.

## 2023-12-12 NOTE — Addendum Note (Signed)
 Addended byLucetta Russel on: 12/12/2023 04:18 PM   Modules accepted: Orders

## 2023-12-12 NOTE — Telephone Encounter (Signed)
 Order in. Please forward to Patty if needed.   Can we cancel her appt in EPIC so she will know?

## 2023-12-12 NOTE — Telephone Encounter (Signed)
 Appt in Epic is canceled. [facility] may require that we have the injections authorized before they will schedule. They did when we sent another patient about a year ago.Miranda AasAaron Hampton

## 2023-12-12 NOTE — Telephone Encounter (Signed)
 LMOM informing patient of cancelled appointment for Monday and that we will place a new referral to get the correct injection done.   It should be to Neuroradiology but it doesn't exist in Epic. You could do AMB ref IR and in the comments put Neuroradiology and the doctors names at [facility].

## 2023-12-12 NOTE — Telephone Encounter (Signed)
 Sent order for left C1-C2 ESI to target left C2 nerve to IR, they only do C1-C2 facet injections not ESI.   Please let patient know injection for Monday is cancelled.   Mont Antis says we can get injection at [facility] with Tawny Fate or Manda Seats. Let me know how to put in this order and I will do it.   Thanks!

## 2023-12-15 ENCOUNTER — Inpatient Hospital Stay
Admission: RE | Admit: 2023-12-15 | Discharge: 2023-12-15 | Disposition: A | Discharge status: Home / Self Care | Source: Ambulatory Visit | Attending: Orthopedic Surgery | Admitting: Orthopedic Surgery

## 2023-12-15 NOTE — Telephone Encounter (Signed)
  Media Information  Document Information  Should I tell the patient that she is being referred to [facility] for injections? Is there a phone number I can provide her with?

## 2023-12-15 NOTE — Telephone Encounter (Signed)
 Spoke to patient and explained the message below. She voiced understanding and wanted us  aware that [place]8.

## 2024-01-20 NOTE — Telephone Encounter (Signed)
 Patient is calling that she is ready to schedule her injection. Miranda Hampton was going to send it to [facility] maybe, patient can not recall.

## 2024-01-20 NOTE — Telephone Encounter (Signed)
 Please let her know we are checking to see if we can get the injections done in [place]. Will let her know- she should hear from us  in next 1-2 days.

## 2024-01-21 ENCOUNTER — Telehealth: Payer: Self-pay | Admitting: Orthopedic Surgery

## 2024-01-21 NOTE — Telephone Encounter (Signed)
 Order done for Left C1-C2 ESI to target left C2 nerve per Dr. 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    Please send to [facility]- Dr. Tawny Fate or Dr. Michaelyn Adu.    Dr. Arleen Bells does not do these injections at this time.   Please let patient know once order is faxed. They should be calling her with an appointment.

## 2024-01-22 NOTE — Telephone Encounter (Signed)
 Referral was faxed to [facility] yesterday afternoon. I just called the office to asked if it was received but has to leave a message [phone]. Patient is aware.

## 2024-01-26 NOTE — Telephone Encounter (Signed)
 I called [facility] and was given another fax number to speed up the process. referral faxed again.

## 2024-01-29 ENCOUNTER — Telehealth: Payer: Self-pay | Admitting: Orthopedic Surgery

## 2024-01-29 NOTE — Telephone Encounter (Signed)
 Miranda Hampton from [facility] called to let our office know that our office would need to obtain authorization for her C1-C2 injection on the left side with [place] 37678. She states that the patient is scheduled for 02/03/2024 and they do not obtain authorizations for external  referrals.   Fax: [phone]

## 2024-01-30 NOTE — Telephone Encounter (Signed)
 Need more information to do authorization such as their NPI, name of provider doing injection. Attempted to call Miranda Hampton x 2 at number provided [phone]). Call won't go through

## 2024-01-30 NOTE — Telephone Encounter (Signed)
 Submitted authorization request on [employer] with [place] as the requesting provider and [facility] as the site. Case is under medical review. Ref# [id number]

## 2024-02-02 NOTE — Telephone Encounter (Signed)
 Demetrio Lever from [facility] has called wanting to know the status of the authorization since patient has an appointment tomorrow for her CT.   Please give her a call once we receive authorization on her direct line, [phone]  OK to leave a voicemail with authorization number and dates.

## 2024-02-02 NOTE — Telephone Encounter (Signed)
 This is scheduled for 02/03/24

## 2024-02-02 NOTE — Telephone Encounter (Signed)
 Case is still pending medical review at this time.

## 2024-02-04 NOTE — Telephone Encounter (Signed)
 Case is still pending medical director review Case # [id number]

## 2024-02-05 NOTE — Telephone Encounter (Signed)
Case is still pending medical director review.

## 2024-02-06 NOTE — Telephone Encounter (Signed)
 Received email from [employer] on 02/06/24 at 10am that [employer] is requesting additional information with a response by 02/09/24. Peer to peer is scheduled for 02/09/24 at 1:30pm with [place] Boys, PA (there are no available openings today with [employer] due to the holiday).

## 2024-02-09 NOTE — Telephone Encounter (Signed)
 Did peer to peer with Dr. Vallery.  Procedure approved  authorization [id number].   Will fax further approval information.   Procedure was done on 02/03/24. If this is not in authorization window, then will need to call back to try to get retroactive approval.

## 2024-02-09 NOTE — Telephone Encounter (Signed)
 Injection is now approved Auth # [id number] Valid 01/30/24-08/07/24  CPT 62321 for 1 unit
